# Patient Record
Sex: Female | Born: 1935 | Race: Black or African American | Hispanic: No | Marital: Married | Smoking: Never smoker
Health system: Southern US, Community
[De-identification: ages and names within clinical notes are randomized; demographics above are authoritative.]

## PROBLEM LIST (undated history)

## (undated) DIAGNOSIS — I214 Non-ST elevation (NSTEMI) myocardial infarction: Secondary | ICD-10-CM

## (undated) DIAGNOSIS — J982 Interstitial emphysema: Secondary | ICD-10-CM

## (undated) DIAGNOSIS — J849 Interstitial pulmonary disease, unspecified: Secondary | ICD-10-CM

## (undated) HISTORY — DX: Non-ST elevation (NSTEMI) myocardial infarction: I21.4

## (undated) HISTORY — DX: Interstitial emphysema: J98.2

---

## 2004-08-10 ENCOUNTER — Ambulatory Visit: Payer: Self-pay | Admitting: Unknown Physician Specialty

## 2005-07-26 ENCOUNTER — Ambulatory Visit: Payer: Self-pay | Admitting: *Deleted

## 2005-08-07 ENCOUNTER — Ambulatory Visit: Payer: Self-pay | Admitting: *Deleted

## 2006-02-19 ENCOUNTER — Ambulatory Visit: Payer: Self-pay | Admitting: *Deleted

## 2006-08-20 ENCOUNTER — Ambulatory Visit: Payer: Self-pay | Admitting: *Deleted

## 2007-08-12 ENCOUNTER — Ambulatory Visit: Payer: Self-pay | Admitting: *Deleted

## 2007-09-04 ENCOUNTER — Ambulatory Visit: Payer: Self-pay | Admitting: *Deleted

## 2008-09-14 ENCOUNTER — Ambulatory Visit: Payer: Self-pay | Admitting: Family Medicine

## 2009-10-11 ENCOUNTER — Ambulatory Visit: Payer: Self-pay | Admitting: Family Medicine

## 2010-10-13 ENCOUNTER — Ambulatory Visit: Payer: Self-pay | Admitting: Family Medicine

## 2010-11-14 ENCOUNTER — Encounter: Payer: Self-pay | Admitting: Family Medicine

## 2010-11-25 ENCOUNTER — Encounter: Payer: Self-pay | Admitting: Family Medicine

## 2011-10-31 ENCOUNTER — Ambulatory Visit: Payer: Self-pay | Admitting: Family Medicine

## 2012-10-31 ENCOUNTER — Ambulatory Visit: Payer: Self-pay | Admitting: Nurse Practitioner

## 2013-11-04 ENCOUNTER — Ambulatory Visit: Payer: Self-pay | Admitting: Family Medicine

## 2016-03-17 ENCOUNTER — Ambulatory Visit
Admission: RE | Admit: 2016-03-17 | Discharge: 2016-03-17 | Disposition: A | Discharge status: Home / Self Care | Payer: Medicare Other | Source: Ambulatory Visit | Attending: Nurse Practitioner | Admitting: Nurse Practitioner

## 2016-03-17 ENCOUNTER — Other Ambulatory Visit: Payer: Self-pay | Admitting: Nurse Practitioner

## 2016-03-17 ENCOUNTER — Inpatient Hospital Stay: Admit: 2016-03-17 | Payer: Self-pay

## 2016-03-17 DIAGNOSIS — R05 Cough: Secondary | ICD-10-CM | POA: Insufficient documentation

## 2016-03-17 DIAGNOSIS — J449 Chronic obstructive pulmonary disease, unspecified: Secondary | ICD-10-CM | POA: Diagnosis not present

## 2016-03-17 DIAGNOSIS — R059 Cough, unspecified: Secondary | ICD-10-CM

## 2016-05-10 DIAGNOSIS — J8410 Pulmonary fibrosis, unspecified: Secondary | ICD-10-CM | POA: Insufficient documentation

## 2016-11-07 DIAGNOSIS — R918 Other nonspecific abnormal finding of lung field: Secondary | ICD-10-CM | POA: Insufficient documentation

## 2017-02-27 ENCOUNTER — Ambulatory Visit: Payer: Medicare Other

## 2017-08-17 DIAGNOSIS — Z5181 Encounter for therapeutic drug level monitoring: Secondary | ICD-10-CM | POA: Insufficient documentation

## 2017-08-17 DIAGNOSIS — Z79899 Other long term (current) drug therapy: Secondary | ICD-10-CM

## 2017-08-20 DIAGNOSIS — R9389 Abnormal findings on diagnostic imaging of other specified body structures: Secondary | ICD-10-CM | POA: Insufficient documentation

## 2017-10-20 ENCOUNTER — Emergency Department: Payer: Medicare Other

## 2017-10-20 ENCOUNTER — Inpatient Hospital Stay
Admission: EM | Admit: 2017-10-20 | Discharge: 2017-10-21 | DRG: 313 | Disposition: A | Discharge status: Home / Self Care | Payer: Medicare Other | Attending: Internal Medicine | Admitting: Internal Medicine

## 2017-10-20 ENCOUNTER — Other Ambulatory Visit: Payer: Self-pay

## 2017-10-20 DIAGNOSIS — I248 Other forms of acute ischemic heart disease: Secondary | ICD-10-CM | POA: Diagnosis not present

## 2017-10-20 DIAGNOSIS — R079 Chest pain, unspecified: Secondary | ICD-10-CM

## 2017-10-20 DIAGNOSIS — Z9981 Dependence on supplemental oxygen: Secondary | ICD-10-CM

## 2017-10-20 DIAGNOSIS — Z8249 Family history of ischemic heart disease and other diseases of the circulatory system: Secondary | ICD-10-CM | POA: Diagnosis not present

## 2017-10-20 DIAGNOSIS — R778 Other specified abnormalities of plasma proteins: Secondary | ICD-10-CM

## 2017-10-20 DIAGNOSIS — I214 Non-ST elevation (NSTEMI) myocardial infarction: Secondary | ICD-10-CM

## 2017-10-20 DIAGNOSIS — R7989 Other specified abnormal findings of blood chemistry: Secondary | ICD-10-CM

## 2017-10-20 DIAGNOSIS — J810 Acute pulmonary edema: Secondary | ICD-10-CM

## 2017-10-20 DIAGNOSIS — D509 Iron deficiency anemia, unspecified: Secondary | ICD-10-CM | POA: Diagnosis present

## 2017-10-20 DIAGNOSIS — J9610 Chronic respiratory failure, unspecified whether with hypoxia or hypercapnia: Secondary | ICD-10-CM | POA: Diagnosis not present

## 2017-10-20 DIAGNOSIS — J811 Chronic pulmonary edema: Secondary | ICD-10-CM | POA: Diagnosis present

## 2017-10-20 DIAGNOSIS — J849 Interstitial pulmonary disease, unspecified: Secondary | ICD-10-CM | POA: Diagnosis not present

## 2017-10-20 HISTORY — DX: Non-ST elevation (NSTEMI) myocardial infarction: I21.4

## 2017-10-20 LAB — COMPREHENSIVE METABOLIC PANEL
ALBUMIN: 3.8 g/dL (ref 3.5–5.0)
ALK PHOS: 53 U/L (ref 38–126)
ALT: 11 U/L — ABNORMAL LOW (ref 14–54)
AST: 25 U/L (ref 15–41)
Anion gap: 6 (ref 5–15)
BILIRUBIN TOTAL: 0.4 mg/dL (ref 0.3–1.2)
BUN: 16 mg/dL (ref 6–20)
CALCIUM: 8.9 mg/dL (ref 8.9–10.3)
CO2: 35 mmol/L — ABNORMAL HIGH (ref 22–32)
Chloride: 95 mmol/L — ABNORMAL LOW (ref 101–111)
Creatinine, Ser: 0.92 mg/dL (ref 0.44–1.00)
GFR calc non Af Amer: 54 mL/min — ABNORMAL LOW (ref >60)
GLUCOSE: 110 mg/dL — AB (ref 65–99)
POTASSIUM: 4.2 mmol/L (ref 3.5–5.1)
Sodium: 136 mmol/L (ref 135–145)
TOTAL PROTEIN: 7.8 g/dL (ref 6.5–8.1)

## 2017-10-20 LAB — CBC WITH DIFFERENTIAL/PLATELET
BASOS PCT: 1 %
Basophils Absolute: 0.1 10*3/uL (ref 0–0.1)
EOS ABS: 0.2 10*3/uL (ref 0–0.7)
Eosinophils Relative: 6 %
HEMATOCRIT: 30.7 % — AB (ref 35.0–47.0)
Hemoglobin: 10.6 g/dL — ABNORMAL LOW (ref 12.0–16.0)
LYMPHS ABS: 1 10*3/uL (ref 1.0–3.6)
Lymphocytes Relative: 22 %
MCH: 33.6 pg (ref 26.0–34.0)
MCHC: 34.6 g/dL (ref 32.0–36.0)
MCV: 97.2 fL (ref 80.0–100.0)
MONO ABS: 0.3 10*3/uL (ref 0.2–0.9)
MONOS PCT: 8 %
Neutro Abs: 2.7 10*3/uL (ref 1.4–6.5)
Neutrophils Relative %: 63 %
Platelets: 250 10*3/uL (ref 150–440)
RBC: 3.16 MIL/uL — ABNORMAL LOW (ref 3.80–5.20)
RDW: 13 % (ref 11.5–14.5)
WBC: 4.3 10*3/uL (ref 3.6–11.0)

## 2017-10-20 LAB — BRAIN NATRIURETIC PEPTIDE: B Natriuretic Peptide: 90 pg/mL (ref 0.0–100.0)

## 2017-10-20 LAB — TROPONIN I: TROPONIN I: 0.03 ng/mL — AB (ref <0.03)

## 2017-10-20 MED ORDER — ASPIRIN 81 MG PO CHEW
324.0000 mg | CHEWABLE_TABLET | Freq: Once | ORAL | Status: AC
  Administered 2017-10-20: 324 mg via ORAL
  Filled 2017-10-20: qty 4

## 2017-10-20 NOTE — ED Triage Notes (Signed)
Pt states approx one hour ago she experienced "a whoosing" in her left chest. Pt with history of interstital lung disease. Pt is on 3lpm [facility] oxygen chronically. Pt denies pain, dizziness, nausea.

## 2017-10-20 NOTE — ED Provider Notes (Signed)
[facility] Emergency Department Provider Note       Time seen: ----------------------------------------- 9:47 PM on 10/20/2017 -----------------------------------------   I have reviewed the triage vital signs and the nursing notes.  HISTORY   Chief Complaint Chest Pain    HPI Molly Carlson is a 84 y.o. female with a history of interstitial lung disease who presents to the ED for a funny feeling in her chest.  Patient states she experienced a whooshing feeling in her left chest.  She has a history of oxygen dependency, wears 3 L of nasal cannula oxygen all the time.  She denies pain, dizziness or nausea.  She denies any other complaints at this time.  No past medical history on file.  There are no active problems to display for this patient.  Allergies Patient has no known allergies.  Social History Social History   Tobacco Use  . Smoking status: Not on file  Substance Use Topics  . Alcohol use: Not on file  . Drug use: Not on file   Review of Systems Constitutional: Negative for fever. Cardiovascular: Positive for chest pain Respiratory: Positive for chronic difficulty breathing Gastrointestinal: Negative for abdominal pain, vomiting and diarrhea. Musculoskeletal: Negative for back pain. Skin: Negative for rash. Neurological: Negative for headaches, focal weakness or numbness.  All systems negative/normal/unremarkable except as stated in the HPI  ____________________________________________   PHYSICAL EXAM:  VITAL SIGNS: ED Triage Vitals [10/20/17 2023]  Enc Vitals Group     BP (!) 147/108     Pulse Rate 78     Resp      Temp 98.8 F (37.1 C)     Temp Source Oral     SpO2 99 %     Weight 90 lb (40.8 kg)     Height  (1.549 m)     Head Circumference      Peak Flow      Pain Score 0     Pain Loc      Pain Edu?      Excl. in GC?    Constitutional: Alert and oriented.  Chronically ill-appearing, no  distress Eyes: Conjunctivae are normal. Normal extraocular movements. ENT   Head: Normocephalic and atraumatic.   Nose: No congestion/rhinnorhea.   Mouth/Throat: Mucous membranes are moist.   Neck: No stridor.  Cardiovascular: Normal rate, regular rhythm. No murmurs, rubs, or gallops. Respiratory: Normal respiratory effort without tachypnea nor retractions.  Rales are noted bilaterally Gastrointestinal: Soft and nontender. Normal bowel sounds Musculoskeletal: Nontender with normal range of motion in extremities. No lower extremity tenderness nor edema.  Severe kyphosis. Neurologic:  Normal speech and language. No gross focal neurologic deficits are appreciated.  Skin:  Skin is warm, dry and intact. No rash noted. Psychiatric: Mood and affect are normal. Speech and behavior are normal.  ____________________________________________  EKG: Interpreted by me.  Sinus rhythm rate 68 bpm, normal PR interval, normal QRS, normal QT.  ____________________________________________  ED COURSE:  As part of my medical decision making, I reviewed the following data within the electronic MEDICAL RECORD NUMBER History obtained from family if available, nursing notes, old chart and ekg, as well as notes from prior ED visits. Patient presented for abnormal feeling in her chest, we will assess with labs and imaging as indicated at this time.   Procedures ____________________________________________   LABS (pertinent positives/negatives)  Labs Reviewed  CBC WITH DIFFERENTIAL/PLATELET - Abnormal; Notable for the following components:      Result Value  RBC 3.16 (*)    Hemoglobin 10.6 (*)    HCT 30.7 (*)    All other components within normal limits  COMPREHENSIVE METABOLIC PANEL - Abnormal; Notable for the following components:   Chloride 95 (*)    CO2 35 (*)    Glucose, Bld 110 (*)    ALT 11 (*)    GFR calc non Af Amer 54 (*)    All other components within normal limits  TROPONIN I -  Abnormal; Notable for the following components:   Troponin I 0.03 (*)    All other components within normal limits  BRAIN NATRIURETIC PEPTIDE    RADIOLOGY Images were viewed by me  Chest x-ray reveals pulmonary edema superimposed on interstitial lung disease  ____________________________________________  DIFFERENTIAL DIAGNOSIS   Interstitial lung disease, pneumonia, pneumothorax, MI, pulmonary edema  FINAL ASSESSMENT AND PLAN  Chest pain, elevated troponin   Plan: The patient had presented for abnormal sensation in her chest. Patient's labs did reveal a slightly elevated troponin of 0.03. Patient's imaging revealed pulmonary edema which is a new finding for her.  I will discuss with the hospitalist for admission.   Molly Dash, MD   Note: This note was generated in part or whole with voice recognition software. Voice recognition is usually quite accurate but there are transcription errors that can and very often do occur. I apologize for any typographical errors that were not detected and corrected.     Molly Filbert, MD 10/20/17 2151

## 2017-10-20 NOTE — H&P (Signed)
[facility] [place] at [facility]   PATIENT NAME: Molly Carlson    MR#:  [id number]  DATE OF BIRTH:  [birth year]  DATE OF ADMISSION:  10/20/2017  PRIMARY CARE PHYSICIAN: Center, [facility]   REQUESTING/REFERRING PHYSICIAN:   CHIEF COMPLAINT:   Chief Complaint  Patient presents with  . Chest Pain    HISTORY OF PRESENT ILLNESS: Molly Carlson  is a 84 y.o. female with a known history of chronic respiratory failure secondary to interstitial lung disease, on continuous 3 L oxygen per nasal cannula at home. Patient presented to emergency room for an episode of whooshing feeling in her left chest, while at rest, at home, just 2 hours before arrival to emergency room.  This symptom was moderate in severity and scared her.  The episode lasted approximately 5 minutes and resolved on its own.  Patient denies having similar experience in the past.  There was no dizziness, nausea, diaphoreses; no cough, fever or chills.  No worsening shortness of breath.  No lower extremities edema. Patient is currently chest pain-free. Blood test done emergency room were remarkable for hemoglobin level of 10.6, which is baseline for this patient.  First troponin level was 0.03.  EKG shows sinus rate with a heart rate of 68, normal intervals, no acute ischemic changes. Chest x-ray, reviewed by myself, reveals mild diffuse interstitial edema superimposed on chronic interstitial lung disease.  Patient will be placed on observation to rule out acute coronary syndrome.  PAST MEDICAL HISTORY: Chronic respiratory failure; interstitial lung disease; anemia of iron deficiency.  PAST SURGICAL HISTORY: Patient does not recall any surgeries.  SOCIAL HISTORY:  Patient denies ever smoking or drinking alcohol.  No recreational drug use.  FAMILY HISTORY: Hypertension and hyperlipidemia in both parents  DRUG ALLERGIES: No Known Allergies  REVIEW OF SYSTEMS:   CONSTITUTIONAL: No  fever, no new fatigue or weakness.  EYES: No blurred or double vision.  EARS, NOSE, AND THROAT: No tinnitus or ear pain.  RESPIRATORY: Positive for chronic shortness of breath with exertion, going on for several years, due to interstitial lung disease.  No cough, wheezing or hemoptysis.   CARDIOVASCULAR: Positive for chest pain; no orthopnea, edema.  GASTROINTESTINAL: No nausea, vomiting, diarrhea or abdominal pain.  GENITOURINARY: No dysuria, hematuria.  ENDOCRINE: No polyuria, nocturia,  HEMATOLOGY: No bleeding. SKIN: No rash or lesion. MUSCULOSKELETAL: No joint pain.   NEUROLOGIC: No focal weakness.  PSYCHIATRY: No anxiety or depression.   MEDICATIONS AT HOME:  Prior to Admission medications   Medication Sig Start Date End Date Taking? Authorizing Provider  azaTHIOprine (IMURAN) 50 MG tablet Take 50 mg by mouth daily.   Yes [provider]  FEROSUL 325 (65 Fe) MG tablet Take 325 mg by mouth 2 (two) times daily.   Yes [provider]  Prenatal Vit-Fe Fumarate-FA (PRENATAL MULTIVITAMIN) TABS tablet Take 1 tablet by mouth daily at 12 noon.   Yes [provider]      PHYSICAL EXAMINATION:   VITAL SIGNS: Blood pressure (!) 147/108, pulse 78, temperature 98.8 F (37.1 C), temperature source Oral, height  (1.549 m), weight 37.6 kg (83 lb), SpO2 99 %.  GENERAL:  84 y.o.-year-old patient lying in the bed with no acute distress.  On 3 L oxygen per nasal cannula. EYES: Pupils equal, round, reactive to light and accommodation. No scleral icterus.  HEENT: Head atraumatic, normocephalic. Oropharynx and nasopharynx clear.  NECK:  Supple, no jugular venous distention. No thyroid enlargement, no  tenderness.  LUNGS: Reduced breath sounds bilaterally, no wheezing, rales,rhonchi or crepitation. No use of accessory muscles of respiration.  CARDIOVASCULAR: S1, S2 normal. No S3/S4.  ABDOMEN: Soft, nontender, nondistended. Bowel sounds present. No organomegaly or mass.   EXTREMITIES: No pedal edema, cyanosis, or clubbing.  NEUROLOGIC: No focal weakness.  PSYCHIATRIC: The patient is alert and oriented x 3.  SKIN: No obvious rash, lesion, or ulcer.   LABORATORY PANEL:   CBC Recent Labs  Lab 10/20/17 2038  WBC 4.3  HGB 10.6*  HCT 30.7*  PLT 250  MCV 97.2  MCH 33.6  MCHC 34.6  RDW 13.0  LYMPHSABS 1.0  MONOABS 0.3  EOSABS 0.2  BASOSABS 0.1   ------------------------------------------------------------------------------------------------------------------  Chemistries  Recent Labs  Lab 10/20/17 2038  NA 136  K 4.2  CL 95*  CO2 35*  GLUCOSE 110*  BUN 16  CREATININE 0.92  CALCIUM 8.9  AST 25  ALT 11*  ALKPHOS 53  BILITOT 0.4   ------------------------------------------------------------------------------------------------------------------ estimated creatinine clearance is 25.1 mL/min (by C-G formula based on SCr of 0.92 mg/dL). ------------------------------------------------------------------------------------------------------------------ No results for input(s): TSH, T4TOTAL, T3FREE, THYROIDAB in the last 72 hours.  Invalid input(s): FREET3   Coagulation profile No results for input(s): INR, PROTIME in the last 168 hours. ------------------------------------------------------------------------------------------------------------------- No results for input(s): DDIMER in the last 72 hours. -------------------------------------------------------------------------------------------------------------------  Cardiac Enzymes Recent Labs  Lab 10/20/17 2038  TROPONINI 0.03*   ------------------------------------------------------------------------------------------------------------------ Invalid input(s): POCBNP  ---------------------------------------------------------------------------------------------------------------  Urinalysis No results found for: COLORURINE, APPEARANCEUR, LABSPEC, PHURINE, GLUCOSEU, HGBUR,  BILIRUBINUR, KETONESUR, PROTEINUR, UROBILINOGEN, NITRITE, LEUKOCYTESUR   RADIOLOGY: Dg Chest 2 View  Result Date: 10/20/2017 CLINICAL DATA:  Interstitial lung disease. Patient is on 3 L of oxygen chronically. Presents with a "wooshing " in the left chest. EXAM: CHEST - 2 VIEW COMPARISON:  03/17/2016 FINDINGS: Stable cardiomegaly and aortic atherosclerosis. Diffuse interstitial lung disease with fine interstitial lung markings noted bilaterally with superimposed areas of interstitial edema on current exam. Stable thoracic kyphosis attributable to multilevel degenerative disc disease and mild anterior mid to upper thoracic wedging. IMPRESSION: Mild diffuse interstitial edema superimposed on chronic interstitial lung disease. Stable aortic atherosclerosis and cardiomegaly. Electronically Signed   By: Tollie Eth M.D.   On: 10/20/2017 21:07    EKG: Orders placed or performed during the hospital encounter of 10/20/17  . ED EKG  . ED EKG  . EKG 12-Lead  . EKG 12-Lead    IMPRESSION AND PLAN:  1. NSTEMI.  First troponin level is 0.03.  This is likely secondary to demand ischemia from chronic respiratory failure.  However, we will rule out ACS.  Will start patient on aspirin will monitor patient on telemetry and follow troponin levels.  Will check 2D echo and consult cardiology for further evaluation and treatment. 2.  Chronic respiratory failure, secondary to pulmonary interstitial fibrosis, on 3 L continuous oxygen, at home.  Continue management per pulmonary services, as outpatient.  Continue oxygen treatment and Imuran. 3.  Pulmonary interstitial fibrosis, see management as above under #2.  All the records are reviewed and case discussed with ED provider. Management plans discussed with the patient, family and they are in agreement.  CODE STATUS: FULL    TOTAL TIME TAKING CARE OF THIS PATIENT: 40 minutes.    Cammy Copa M.D on 10/20/2017 at 11:33 PM  Between 7am to 6pm - Pager -  [phone]  After 6pm go to [email or web address] - password [email or web address] [facility]  [place] [place] [facility]  Office  [phone]  CC: Primary  care physician; Center, [facility]

## 2017-10-21 ENCOUNTER — Encounter: Payer: Self-pay | Admitting: Emergency Medicine

## 2017-10-21 ENCOUNTER — Other Ambulatory Visit: Payer: Self-pay

## 2017-10-21 DIAGNOSIS — J810 Acute pulmonary edema: Secondary | ICD-10-CM | POA: Diagnosis not present

## 2017-10-21 DIAGNOSIS — R079 Chest pain, unspecified: Secondary | ICD-10-CM | POA: Diagnosis not present

## 2017-10-21 LAB — BASIC METABOLIC PANEL
Anion gap: 5 (ref 5–15)
BUN: 14 mg/dL (ref 6–20)
CHLORIDE: 97 mmol/L — AB (ref 101–111)
CO2: 37 mmol/L — ABNORMAL HIGH (ref 22–32)
CREATININE: 0.87 mg/dL (ref 0.44–1.00)
Calcium: 8.9 mg/dL (ref 8.9–10.3)
GFR, EST NON AFRICAN AMERICAN: 58 mL/min — AB (ref >60)
Glucose, Bld: 98 mg/dL (ref 65–99)
Potassium: 4.1 mmol/L (ref 3.5–5.1)
SODIUM: 139 mmol/L (ref 135–145)

## 2017-10-21 LAB — GLUCOSE, CAPILLARY: GLUCOSE-CAPILLARY: 80 mg/dL (ref 65–99)

## 2017-10-21 LAB — CBC
HCT: 30.5 % — ABNORMAL LOW (ref 35.0–47.0)
Hemoglobin: 10.3 g/dL — ABNORMAL LOW (ref 12.0–16.0)
MCH: 33.1 pg (ref 26.0–34.0)
MCHC: 33.9 g/dL (ref 32.0–36.0)
MCV: 97.5 fL (ref 80.0–100.0)
PLATELETS: 250 10*3/uL (ref 150–440)
RBC: 3.12 MIL/uL — AB (ref 3.80–5.20)
RDW: 12.8 % (ref 11.5–14.5)
WBC: 4.7 10*3/uL (ref 3.6–11.0)

## 2017-10-21 LAB — TROPONIN I: Troponin I: <0.03 ng/mL (ref <0.03)

## 2017-10-21 MED ORDER — AZATHIOPRINE 50 MG PO TABS
50.0000 mg | ORAL_TABLET | Freq: Every day | ORAL | Status: DC
  Administered 2017-10-21: 50 mg via ORAL
  Filled 2017-10-21: qty 1

## 2017-10-21 MED ORDER — HYDROCODONE-ACETAMINOPHEN 5-325 MG PO TABS: 1.0000 | ORAL_TABLET | ORAL | Status: DC | PRN

## 2017-10-21 MED ORDER — TRAZODONE HCL 50 MG PO TABS: 25.0000 mg | ORAL_TABLET | Freq: Every evening | ORAL | Status: DC | PRN

## 2017-10-21 MED ORDER — PRENATAL MULTIVITAMIN CH
1.0000 | ORAL_TABLET | Freq: Every day | ORAL | Status: DC
  Administered 2017-10-21: 1 via ORAL
  Filled 2017-10-21: qty 1

## 2017-10-21 MED ORDER — ACETAMINOPHEN 325 MG PO TABS: 650.0000 mg | ORAL_TABLET | Freq: Four times a day (QID) | ORAL | Status: DC | PRN

## 2017-10-21 MED ORDER — ONDANSETRON HCL 4 MG/2ML IJ SOLN: 4.0000 mg | Freq: Four times a day (QID) | INTRAMUSCULAR | Status: DC | PRN

## 2017-10-21 MED ORDER — HEPARIN SODIUM (PORCINE) 5000 UNIT/ML IJ SOLN
5000.0000 [IU] | Freq: Three times a day (TID) | INTRAMUSCULAR | Status: DC
  Filled 2017-10-21: qty 1

## 2017-10-21 MED ORDER — DOCUSATE SODIUM 100 MG PO CAPS
100.0000 mg | ORAL_CAPSULE | Freq: Two times a day (BID) | ORAL | Status: DC
  Administered 2017-10-21: 100 mg via ORAL
  Filled 2017-10-21: qty 1

## 2017-10-21 MED ORDER — ONDANSETRON HCL 4 MG PO TABS: 4.0000 mg | ORAL_TABLET | Freq: Four times a day (QID) | ORAL | Status: DC | PRN

## 2017-10-21 MED ORDER — FERROUS SULFATE 325 (65 FE) MG PO TABS
325.0000 mg | ORAL_TABLET | Freq: Two times a day (BID) | ORAL | Status: DC
  Administered 2017-10-21: 325 mg via ORAL
  Filled 2017-10-21: qty 1

## 2017-10-21 MED ORDER — BISACODYL 5 MG PO TBEC: 5.0000 mg | DELAYED_RELEASE_TABLET | Freq: Every day | ORAL | Status: DC | PRN

## 2017-10-21 MED ORDER — ACETAMINOPHEN 650 MG RE SUPP: 650.0000 mg | Freq: Four times a day (QID) | RECTAL | Status: DC | PRN

## 2017-10-21 NOTE — Care Management Obs Status (Signed)
MEDICARE OBSERVATION STATUS NOTIFICATION   Patient Details  Name: MICHELENE KENISTON MRN: [id number] Date of Birth: [birth year]   Medicare Observation Status Notification Given:  Yes    Marily Memos, RN 10/21/2017, 9:22 AM

## 2017-10-21 NOTE — Plan of Care (Signed)
  Problem: Clinical Measurements: Goal: Cardiovascular complication will be avoided Outcome: Progressing   Problem: Education: Goal: Knowledge of General Education information will improve Outcome: Progressing

## 2017-10-21 NOTE — ED Notes (Signed)
Transport patient to floor room 2A-240.AS

## 2017-10-21 NOTE — ED Notes (Signed)
Delay calling report d/t CODE STROKE in room 16 and establish IV

## 2017-10-21 NOTE — Discharge Summary (Signed)
[facility] - [place] at [facility]   PATIENT NAME: Molly Carlson    MR#:  [id number]  DATE OF BIRTH:  [birth year]  DATE OF ADMISSION:  10/20/2017 ADMITTING PHYSICIAN: Cammy Copa, MD  DATE OF DISCHARGE: 10/21/2017  PRIMARY CARE PHYSICIAN: Center, [facility]    ADMISSION DIAGNOSIS:  Acute pulmonary edema (HCC) [J81.0] Elevated troponin I level [R74.8] Nonspecific chest pain [R07.9]  DISCHARGE DIAGNOSIS:  Chest pain--appears non cardiac Known h/o Interstitial lung disease on Chronic home oxygen  SECONDARY DIAGNOSIS:  History reviewed. No pertinent past medical history.  HOSPITAL COURSE:    Molly Carlson  is a 84 y.o. female with a known history of chronic respiratory failure secondary to interstitial lung disease, on continuous 3 L oxygen per nasal cannula at home. Patient presented to emergency room for an episode of whooshing feeling in her left chest, while at rest, at home, just 2 hours before arrival to emergency room.  1. Chest discomfort -ruled out MI so far with neg troponin x 2 -This is likely secondary to demand ischemia from chronic respiratory failure. -pt is not having any complaints and wants to go home. -EKG no acute changes. -Patient has no underlying cardiac history.  2.  Chronic respiratory failure, secondary to pulmonary interstitial fibrosis, on 3 L continuous oxygen, at home.  - Continue management per pulmonary services, as outpatient.   Continue oxygen treatment and Imuran.  3.  Pulmonary interstitial fibrosis, see management as above under #2.   Overall hemodynamically stable at baseline. Patient is requesting to go home.   CONSULTS OBTAINED:    DRUG ALLERGIES:  No Known Allergies  DISCHARGE MEDICATIONS:   Allergies as of 10/21/2017   No Known Allergies     Medication List    TAKE these medications   azaTHIOprine 50 MG tablet Commonly known as:  IMURAN Take 50 mg by mouth daily.    FEROSUL 325 (65 FE) MG tablet Generic drug:  ferrous sulfate Take 325 mg by mouth 2 (two) times daily.   prenatal multivitamin Tabs tablet Take 1 tablet by mouth daily at 12 noon.       If you experience worsening of your admission symptoms, develop shortness of breath, life threatening emergency, suicidal or homicidal thoughts you must seek medical attention immediately by calling 911 or calling your MD immediately  if symptoms less severe.  You Must read complete instructions/literature along with all the possible adverse reactions/side effects for all the Medicines you take and that have been prescribed to you. Take any new Medicines after you have completely understood and accept all the possible adverse reactions/side effects.   Please note  You were cared for by a hospitalist during your hospital stay. If you have any questions about your discharge medications or the care you received while you were in the hospital after you are discharged, you can call the unit and asked to speak with the hospitalist on call if the hospitalist that took care of you is not available. Once you are discharged, your primary care physician will handle any further medical issues. Please note that NO REFILLS for any discharge medications will be authorized once you are discharged, as it is imperative that you return to your primary care physician (or establish a relationship with a primary care physician if you do not have one) for your aftercare needs so that they can reassess your need for medications and monitor your lab values. Today   SUBJECTIVE   Doing well. requesting  to go home  VITAL SIGNS:  Blood pressure 101/77, pulse (!) 52, temperature (!) 97.3 F (36.3 C), temperature source Oral, resp. rate 18, height  (1.549 m), weight 35.8 kg (79 lb), SpO2 100 %.  I/O:    Intake/Output Summary (Last 24 hours) at 10/21/2017 0942 Last data filed at 10/21/2017 0356 Gross per 24 hour  Intake -   Output 0 ml  Net 0 ml    PHYSICAL EXAMINATION:  GENERAL:  84 y.o.-year-old patient lying in the bed with no acute distress. Thin, fraile EYES: Pupils equal, round, reactive to light and accommodation. No scleral icterus. Extraocular muscles intact.  HEENT: Head atraumatic, normocephalic. Oropharynx and nasopharynx clear.  NECK:  Supple, no jugular venous distention. No thyroid enlargement, no tenderness.  LUNGS: Normal breath sounds bilaterally, no wheezing, no rales, no rhonchi  -few scattered crepitation. No use of accessory muscles of respiration.  CARDIOVASCULAR: S1, S2 normal. No murmurs, rubs, or gallops.  ABDOMEN: Soft, non-tender, non-distended. Bowel sounds present. No organomegaly or mass.  EXTREMITIES: No pedal edema, cyanosis, or clubbing.  NEUROLOGIC: Cranial nerves II through XII are intact. Muscle strength 5/5 in all extremities. Sensation intact. Gait not checked.  PSYCHIATRIC:  patient is alert and oriented x 3.  SKIN: No obvious rash, lesion, or ulcer.   DATA REVIEW:   CBC  Recent Labs  Lab 10/21/17 0206  WBC 4.7  HGB 10.3*  HCT 30.5*  PLT 250    Chemistries  Recent Labs  Lab 10/20/17 2038 10/21/17 0206  NA 136 139  K 4.2 4.1  CL 95* 97*  CO2 35* 37*  GLUCOSE 110* 98  BUN 16 14  CREATININE 0.92 0.87  CALCIUM 8.9 8.9  AST 25  --   ALT 11*  --   ALKPHOS 53  --   BILITOT 0.4  --     Microbiology Results   No results found for this or any previous visit (from the past 240 hour(s)).  RADIOLOGY:  Dg Chest 2 View  Result Date: 10/20/2017 CLINICAL DATA:  Interstitial lung disease. Patient is on 3 L of oxygen chronically. Presents with a "wooshing " in the left chest. EXAM: CHEST - 2 VIEW COMPARISON:  03/17/2016 FINDINGS: Stable cardiomegaly and aortic atherosclerosis. Diffuse interstitial lung disease with fine interstitial lung markings noted bilaterally with superimposed areas of interstitial edema on current exam. Stable thoracic kyphosis  attributable to multilevel degenerative disc disease and mild anterior mid to upper thoracic wedging. IMPRESSION: Mild diffuse interstitial edema superimposed on chronic interstitial lung disease. Stable aortic atherosclerosis and cardiomegaly. Electronically Signed   By: Tollie Eth M.D.   On: 10/20/2017 21:07     Management plans discussed with the patient, family and they are in agreement.  CODE STATUS:     Code Status Orders  (From admission, onward)        Start     Ordered   10/21/17 0116  Full code  Continuous     10/21/17 0115    Code Status History    This patient has a current code status but no historical code status.    Advance Directive Documentation     Most Recent Value  Type of Advance Directive  Healthcare Power of Attorney, Living will  Pre-existing out of facility DNR order (yellow form or pink MOST form)  -  "MOST" Form in Place?  -      TOTAL TIME TAKING CARE OF THIS PATIENT: 40 minutes.    Nani [email or web address].D  on 10/21/2017 at 9:42 AM  Between 7am to 6pm - Pager - [phone] After 6pm go to [email or web address] - Social research officer, government  [facility]  Office  [phone]  CC: Primary care physician; Center, [facility]

## 2017-10-21 NOTE — Progress Notes (Signed)
Pt to be discharged home this morning, she voices no complaints with MD at bedside, states she "feels fine" "ready to go home." VSS, discharged home with husband, follow up plan/information provided, no medication changes.

## 2017-10-23 ENCOUNTER — Telehealth: Payer: Self-pay

## 2017-10-23 NOTE — Telephone Encounter (Signed)
EMMI Follow-up: Molly Carlson called and left me a VM saying she had received a call from my number.  I called and left VM and explained our follow-up process post discharge and to expect another automated call.  If she has any concerns please call my number.

## 2018-01-08 ENCOUNTER — Emergency Department: Payer: Medicare Other

## 2018-01-08 ENCOUNTER — Other Ambulatory Visit: Payer: Self-pay

## 2018-01-08 ENCOUNTER — Emergency Department
Admission: EM | Admit: 2018-01-08 | Discharge: 2018-01-09 | Disposition: A | Discharge status: Home / Self Care | Payer: Medicare Other | Attending: Emergency Medicine | Admitting: Emergency Medicine

## 2018-01-08 DIAGNOSIS — J982 Interstitial emphysema: Secondary | ICD-10-CM | POA: Diagnosis not present

## 2018-01-08 DIAGNOSIS — R079 Chest pain, unspecified: Secondary | ICD-10-CM | POA: Diagnosis present

## 2018-01-08 DIAGNOSIS — I252 Old myocardial infarction: Secondary | ICD-10-CM | POA: Diagnosis not present

## 2018-01-08 DIAGNOSIS — Z79899 Other long term (current) drug therapy: Secondary | ICD-10-CM | POA: Diagnosis not present

## 2018-01-08 HISTORY — DX: Interstitial pulmonary disease, unspecified: J84.9

## 2018-01-08 LAB — TROPONIN I
TROPONIN I: 0.04 ng/mL — AB (ref <0.03)
Troponin I: 0.03 ng/mL (ref <0.03)

## 2018-01-08 LAB — CBC
HCT: 34.8 % — ABNORMAL LOW (ref 35.0–47.0)
Hemoglobin: 12.4 g/dL (ref 12.0–16.0)
MCH: 34.8 pg — ABNORMAL HIGH (ref 26.0–34.0)
MCHC: 35.5 g/dL (ref 32.0–36.0)
MCV: 98 fL (ref 80.0–100.0)
PLATELETS: 277 10*3/uL (ref 150–440)
RBC: 3.55 MIL/uL — ABNORMAL LOW (ref 3.80–5.20)
RDW: 13.7 % (ref 11.5–14.5)
WBC: 8.6 10*3/uL (ref 3.6–11.0)

## 2018-01-08 LAB — COMPREHENSIVE METABOLIC PANEL
ALT: 18 U/L (ref 0–44)
AST: 26 U/L (ref 15–41)
Albumin: 3.8 g/dL (ref 3.5–5.0)
Alkaline Phosphatase: 45 U/L (ref 38–126)
Anion gap: 9 (ref 5–15)
BILIRUBIN TOTAL: 0.7 mg/dL (ref 0.3–1.2)
BUN: 17 mg/dL (ref 8–23)
CALCIUM: 9.2 mg/dL (ref 8.9–10.3)
CO2: 34 mmol/L — ABNORMAL HIGH (ref 22–32)
Chloride: 93 mmol/L — ABNORMAL LOW (ref 98–111)
Creatinine, Ser: 0.86 mg/dL (ref 0.44–1.00)
GFR, EST NON AFRICAN AMERICAN: 59 mL/min — AB (ref >60)
Glucose, Bld: 103 mg/dL — ABNORMAL HIGH (ref 70–99)
POTASSIUM: 4 mmol/L (ref 3.5–5.1)
Sodium: 136 mmol/L (ref 135–145)
Total Protein: 7.8 g/dL (ref 6.5–8.1)

## 2018-01-08 NOTE — ED Notes (Signed)
This tech answered pts call bell. Pt requested TV remote, warm blanket, tissues and paper towels. This tech got the pt the four items and asked the pt if she needed anything else. The pt denied needing anything else at the time. The call bell was placed within reach of the pt before this tech left the room.

## 2018-01-08 NOTE — ED Notes (Signed)
Report given to Andrea RN

## 2018-01-08 NOTE — ED Notes (Signed)
Called Xray and informed that they don't perform the UGI W/KUB at night and that per the ER MD earlier that it would be done tomorrow. Will inform the Admitting MD Anne HahnWillis

## 2018-01-08 NOTE — ED Notes (Addendum)
Verbal permission via telephone received from pt's husband (POA) to release information to pt's daughter; verified by verbal conformation with [name], [employer] Clerk.  Molly CommanderReginald Carlson (husband) [phone] Molly StabsJackie Carlson (daughter) [phone]

## 2018-01-08 NOTE — ED Provider Notes (Signed)
[facility] Provider Note  ____________________________________________   First MD Initiated Contact with Patient 01/08/18 1730     (approximate)  I have reviewed the triage vital signs and the nursing notes.   HISTORY  Chief Complaint Chest Pain  HPI Molly Carlson is a 84 y.o. female Struve interstitial lung disease on nasal cannula oxygen who was presented to the emergency department today with left-sided chest pain that is now resolved.  Says that on Sunday she felt like she had crackers stuck in her throat.  She then coughed them out forcefully.  However, she says that she has been feeling vaguely ill since then and saw her doctor this morning who discharged her home without any further treatment.  She said that after being discharged she then had diffuse chest pain which resolved quickly.  However, she was concerned enough to come to the emergency department for further evaluation.  Denies any shortness of breath.  Denies any pain at this time.   Past Medical History:  Diagnosis Date  . Interstitial lung disease [facility](HCC)     Patient Active Problem List   Diagnosis Date Noted  . NSTEMI (non-ST elevated myocardial infarction) (HCC) 10/20/2017    History reviewed. No pertinent surgical history.  Prior to Admission medications   Medication Sig Start Date End Date Taking? Authorizing Provider  ATROVENT HFA 17 MCG/ACT inhaler Inhale 2 puffs into the lungs 2 (two) times daily. 11/30/17  Yes [provider]  azaTHIOprine (IMURAN) 50 MG tablet Take 100 mg by mouth daily.    Yes [provider]  FEROSUL 325 (65 Fe) MG tablet Take 325 mg by mouth 2 (two) times daily.   Yes [provider]  montelukast (SINGULAIR) 10 MG tablet Take 10 mg by mouth at bedtime. 11/16/17  Yes [provider]  predniSONE (DELTASONE) 10 MG tablet Take 15 mg by mouth daily. 12/15/17  Yes [provider]  Prenatal  Vit-Fe Fumarate-FA (PRENATAL 19) tablet Chew 1 tablet by mouth daily. 11/05/17  Yes [provider]  Prenatal Vit-Fe Fumarate-FA (PRENATAL MULTIVITAMIN) TABS tablet Take 1 tablet by mouth daily at 12 noon.   Yes [provider]  PROAIR HFA 108 (90 Base) MCG/ACT inhaler Inhale 2 puffs into the lungs every 6 (six) hours as needed. 11/16/17  Yes [provider]  sulfamethoxazole-trimethoprim (BACTRIM DS,SEPTRA DS) 800-160 MG tablet Take 1 tablet by mouth 3 (three) times a week. MONDAY,WEDNESDAY,FRIDAY 12/26/17  Yes [provider]    Allergies Patient has no known allergies.  No family history on file.  Social History Social History   Tobacco Use  . Smoking status: Never Smoker  . Smokeless tobacco: Never Used  Substance Use Topics  . Alcohol use: Never    Frequency: Never  . Drug use: Never    Review of Systems  Constitutional: No fever/chills Eyes: No visual changes. ENT: No sore throat. Cardiovascular: As above Respiratory: Denies shortness of breath. Gastrointestinal: No abdominal pain.  No nausea, no vomiting.  No diarrhea.  No constipation. Genitourinary: Negative for dysuria. Musculoskeletal: Negative for back pain. Skin: Negative for rash. Neurological: Negative for headaches, focal weakness or numbness.   ____________________________________________   PHYSICAL EXAM:  VITAL SIGNS: ED Triage Vitals [01/08/18 1553]  Enc Vitals Group     BP 123/68     Pulse Rate (!) 103     Resp 15     Temp 98.2 F (36.8 C)     Temp src  SpO2 97 %     Weight 80 lb (36.3 kg)     Height 5\' 3"  (1.6 m)     Head Circumference      Peak Flow      Pain Score 0     Pain Loc      Pain Edu?      Excl. in GC?     Constitutional: Alert and oriented. Well appearing and in no acute distress. Eyes: Conjunctivae are normal.  Head: Atraumatic. Nose: No congestion/rhinnorhea. Mouth/Throat: Mucous membranes are moist.  Neck: No stridor.     Cardiovascular: Normal rate, regular rhythm. Grossly normal heart sounds. Respiratory: Normal respiratory effort.  No retractions. Lungs CTAB.  No crepitus palpated. Gastrointestinal: Soft and nontender. No distention. No CVA tenderness. Musculoskeletal: No lower extremity tenderness nor edema.  No joint effusions. Neurologic:  Normal speech and language. No gross focal neurologic deficits are appreciated. Skin:  Skin is warm, dry and intact. No rash noted. Psychiatric: Mood and affect are normal. Speech and behavior are normal.  ____________________________________________   LABS (all labs ordered are listed, but only abnormal results are displayed)  Labs Reviewed  CBC - Abnormal; Notable for the following components:      Result Value   RBC 3.55 (*)    HCT 34.8 (*)    MCH 34.8 (*)    All other components within normal limits  TROPONIN I - Abnormal; Notable for the following components:   Troponin I 0.03 (*)    All other components within normal limits  COMPREHENSIVE METABOLIC PANEL - Abnormal; Notable for the following components:   Chloride 93 (*)    CO2 34 (*)    Glucose, Bld 103 (*)    GFR calc non Af Amer 59 (*)    All other components within normal limits  TROPONIN I   ____________________________________________  EKG  ED ECG REPORT I, Arelia Longest, the attending physician, personally viewed and interpreted this ECG.   Date: 01/08/2018  EKG Time: 1603  Rate: 98  Rhythm: Sinus rhythm  Axis: Normal  Intervals:none  ST&T Change: No ST segment elevation or depression.  No abnormal T wave inversion.  ____________________________________________  RADIOLOGY  Extensive bilateral interstitial lung disease with subpleural areas of reticular fine interstitial lung markings compatible with interstitial fibrosis.  Pneumomediastinum tracking up into the base of the neck in the left and supraclavicular soft  tissues. ____________________________________________   PROCEDURES  Procedure(s) performed:   Procedures  Critical Care performed:   ____________________________________________   INITIAL IMPRESSION / ASSESSMENT AND PLAN / ED COURSE  Pertinent labs & imaging results that were available during my care of the patient were reviewed by me and considered in my medical decision making (see chart for details).  Differential diagnosis includes, but is not limited to, ACS, aortic dissection, pulmonary embolism, cardiac tamponade, pneumothorax, pneumonia, pericarditis, myocarditis, GI-related causes including esophagitis/gastritis, and musculoskeletal chest wall pain.   As part of my medical decision making, I reviewed the following data within the electronic MEDICAL RECORD NUMBER Notes from prior ED visits  ----------------------------------------- 8:33 PM on 01/08/2018 -----------------------------------------  Patient with likely increased intrathoracic pressure this past Sunday when she coughed up the food bolus.  However with recurrent chest pain today and findings of pneumomediastinum.  Concern for possible ruptured bleb versus ruptured esophagus.  Discussed the case with Dr. Thelma Barge of CT surgery who recommends an upper GI.  If negative, the patient will be admitted to medicine for observation.  Signed out to  Dr. Elpidio Anis.  Also discussed with Dr. Scotty Court in case of evidence of esophageal rupture. ____________________________________________   FINAL CLINICAL IMPRESSION(S) / ED DIAGNOSES  Final diagnoses:  Pneumomediastinum (HCC)      NEW MEDICATIONS STARTED DURING THIS VISIT:  New Prescriptions   No medications on file     Note:  This document was prepared using Dragon voice recognition software and may include unintentional dictation errors.     Myrna Blazer, MD 01/08/18 2034

## 2018-01-08 NOTE — ED Triage Notes (Signed)
Pt went to PCP this am for c/o chest pain and they told her to come back in 2 weeks - when she got home the chest pain got worse and she had nausea and felt like she needed to "burp" - pt reports increased shortness of breath - denies dizziness

## 2018-01-08 NOTE — ED Notes (Signed)
Per Dr Scotty CourtStafford, okay for pt to eat and drink.

## 2018-01-08 NOTE — ED Notes (Addendum)
This tech answered call bell again. Pt was half way out of the bed asking to walk around the room because of a leg cramp. This tech told the pt she could not get up until this tech could get a portable oxygen tank for the pt to use to move around. Pt stood up anyway and then sat back down and told this tech her leg was better and she did not need anything else. Pts call bell was placed within reach.

## 2018-01-08 NOTE — ED Notes (Signed)
Pt given ice water and sandwich tray at this time.

## 2018-01-09 ENCOUNTER — Encounter: Payer: Self-pay | Admitting: Cardiothoracic Surgery

## 2018-01-09 ENCOUNTER — Emergency Department: Payer: Medicare Other

## 2018-01-09 DIAGNOSIS — J982 Interstitial emphysema: Secondary | ICD-10-CM | POA: Insufficient documentation

## 2018-01-09 MED ORDER — MORPHINE SULFATE (PF) 2 MG/ML IV SOLN: 2.0000 mg | Freq: Once | INTRAVENOUS | Status: DC

## 2018-01-09 MED ORDER — IOPAMIDOL (ISOVUE-300) INJECTION 61%
100.0000 mL | Freq: Once | INTRAVENOUS | Status: AC | PRN
  Administered 2018-01-09: 100 mL via ORAL

## 2018-01-09 NOTE — ED Notes (Signed)
Pt taken to Xray at this time.

## 2018-01-09 NOTE — ED Notes (Signed)
Pt returned from X-ray.  

## 2018-01-09 NOTE — ED Notes (Signed)
NAD noted at time of D/C. Pt taken to lobby via wheelchair. Pt and husband state understanding regarding following up with Dr. Inez Catalinaakes.

## 2018-01-09 NOTE — ED Provider Notes (Signed)
Patient is undergone extensive evaluation in the ER, chest x-ray does show some improvement.  She is been evaluated by Dr. Thelma Bargeaks and will follow up with him in the office.   Molly FilbertWilliams, Ruffus Kamaka E, MD 01/09/18 [phone]

## 2018-01-09 NOTE — Progress Notes (Signed)
Patient ID: Molly Carlson, female   DOB: Aug 20, 1926, 84 y.o.   MRN: [id number]  No chief complaint on file.   Referred By Dr. Daryel November Reason for Referral pneumomediastinum  HPI Location, Quality, Duration, Severity, Timing, Context, Modifying Factors, Associated Signs and Symptoms.  Molly Carlson is a 84 y.o. female.  I have been asked to see Molly Carlson for consultation regarding her pneumomediastinum.  She is an 84 year old woman who awoke yesterday afternoon with the feeling of intense substernal chest pain.  She previously been seen by her primary care physician for a similar problem but this was much more severe at this point.  She also states that she had perhaps a cough and feeling of food being stuck in her throat.  After a coughing episode she came to the emergency department where chest x-ray was made.  She had some pneumomediastinum and some subcutaneous emphysema and a subsequent CT scan confirmed the presence of a pneumomediastinum with subcutaneous emphysema but no pneumothorax or pleural effusion.  The patient then had a barium swallow which did not reveal any evidence of perforation and a repeat chest x-ray performed approximately 12 hours later that did not reveal any pleural effusion or pneumothorax.  When I saw the patient she was currently asymptomatic.  She states that she was hungry.  She did not have any pain or shortness of breath.  She is on oxygen at home and has carried a diagnosis of interstitial lung disease.   Past Medical History:  Diagnosis Date  . Interstitial lung disease (HCC)     No past surgical history on file.  No family history on file.  Social History Social History   Tobacco Use  . Smoking status: Never Smoker  . Smokeless tobacco: Never Used  Substance Use Topics  . Alcohol use: Never    Frequency: Never  . Drug use: Never    No Known Allergies  Current Outpatient Medications  Medication Sig Dispense  Refill  . ATROVENT HFA 17 MCG/ACT inhaler Inhale 2 puffs into the lungs 2 (two) times daily.  2  . azaTHIOprine (IMURAN) 50 MG tablet Take 100 mg by mouth daily.     . FEROSUL 325 (65 Fe) MG tablet Take 325 mg by mouth 2 (two) times daily.    . montelukast (SINGULAIR) 10 MG tablet Take 10 mg by mouth at bedtime.  2  . predniSONE (DELTASONE) 10 MG tablet Take 15 mg by mouth daily.  0  . Prenatal Vit-Fe Fumarate-FA (PRENATAL 19) tablet Chew 1 tablet by mouth daily.  2  . Prenatal Vit-Fe Fumarate-FA (PRENATAL MULTIVITAMIN) TABS tablet Take 1 tablet by mouth daily at 12 noon.    Marland Kitchen PROAIR HFA 108 (90 Base) MCG/ACT inhaler Inhale 2 puffs into the lungs every 6 (six) hours as needed.  11  . sulfamethoxazole-trimethoprim (BACTRIM DS,SEPTRA DS) 800-160 MG tablet Take 1 tablet by mouth 3 (three) times a week. MONDAY,WEDNESDAY,FRIDAY  6   No current facility-administered medications for this visit.       Review of Systems A complete review of systems was asked and was negative except for the following positive findings see the above history of present illness  There were no vitals taken for this visit.  Physical Exam CONSTITUTIONAL:  Pleasant, well-developed, quite thin, and in no acute distress. EYES: Pupils equal and reactive to light, Sclera non-icteric EARS, NOSE, MOUTH AND THROAT:  The oropharynx was clear.  Dentition is good repair.  Oral mucosa pink and  moist. LYMPH NODES:  Lymph nodes in the neck and axillae were normal RESPIRATORY:  Lungs revealed extensive dry Velcro rales.  Normal respiratory effort without pathologic use of accessory muscles of respiration CARDIOVASCULAR: Heart was regular without murmurs.  There were no carotid bruits. GI: The abdomen was soft, nontender, and nondistended. There were no palpable masses. There was no hepatosplenomegaly. There were normal bowel sounds in all quadrants. GU:  Rectal deferred.   MUSCULOSKELETAL:  Normal muscle strength and tone.  No  clubbing or cyanosis.   SKIN:  There were no pathologic skin lesions.  There were no nodules on palpation. NEUROLOGIC:  Sensation is normal.  Cranial nerves are grossly intact. PSYCH:  Oriented to person, place and time.  Mood and affect are normal.  Data Reviewed CT scan and barium swallow  I have personally reviewed the patient's imaging, laboratory findings and medical records.    Assessment    Pneumomediastinum most likely secondary to tracking along a ruptured alveolar bleb.    Plan    At the present time she is asymptomatic and there is no sign of esophageal perforation.  I would continue to manage her pulmonary status on an outpatient and I be happy to see her in the office if needed.  I did give the patient my business card and asked her to contact us to make an appointment in 1 week.  We will obtain another chest x-ray at that time just to be certain that there is no other findings.  Thank you very much for allowing me to participate in her care       Hulda Marinimothy Briea Mcenery, MD 01/09/2018, 2:53 PM

## 2018-01-09 NOTE — Progress Notes (Unsigned)
Patient ID: Molly Carlson, female   DOB: 10/18/1926, 84 y.o.   MRN: [id number]  No chief complaint on file.   Referred By *** Reason for Referral ***  HPI Location, Quality, Duration, Severity, Timing, Context, Modifying Factors, Associated Signs and Symptoms.  Molly Carlson is a 84 y.o. female.  ***   Past Medical History:  Diagnosis Date  . Interstitial lung disease (HCC)     No past surgical history on file.  No family history on file.  Social History Social History   Tobacco Use  . Smoking status: Never Smoker  . Smokeless tobacco: Never Used  Substance Use Topics  . Alcohol use: Never    Frequency: Never  . Drug use: Never    No Known Allergies  Current Outpatient Medications  Medication Sig Dispense Refill  . ATROVENT HFA 17 MCG/ACT inhaler Inhale 2 puffs into the lungs 2 (two) times daily.  2  . azaTHIOprine (IMURAN) 50 MG tablet Take 100 mg by mouth daily.     . FEROSUL 325 (65 Fe) MG tablet Take 325 mg by mouth 2 (two) times daily.    . montelukast (SINGULAIR) 10 MG tablet Take 10 mg by mouth at bedtime.  2  . predniSONE (DELTASONE) 10 MG tablet Take 15 mg by mouth daily.  0  . Prenatal Vit-Fe Fumarate-FA (PRENATAL 19) tablet Chew 1 tablet by mouth daily.  2  . Prenatal Vit-Fe Fumarate-FA (PRENATAL MULTIVITAMIN) TABS tablet Take 1 tablet by mouth daily at 12 noon.    Marland Kitchen. PROAIR HFA 108 (90 Base) MCG/ACT inhaler Inhale 2 puffs into the lungs every 6 (six) hours as needed.  11  . sulfamethoxazole-trimethoprim (BACTRIM DS,SEPTRA DS) 800-160 MG tablet Take 1 tablet by mouth 3 (three) times a week. MONDAY,WEDNESDAY,FRIDAY  6   No current facility-administered medications for this visit.       Review of Systems A complete review of systems was asked and was negative except for the following positive findings***  There were no vitals taken for this visit.  Physical Exam CONSTITUTIONAL:  Pleasant, well-developed, well-nourished, and in no acute  distress. EYES: Pupils equal and reactive to light, Sclera non-icteric EARS, NOSE, MOUTH AND THROAT:  The oropharynx was clear.  Dentition is good repair.  Oral mucosa pink and moist. LYMPH NODES:  Lymph nodes in the neck and axillae were normal RESPIRATORY:  Lungs were clear.  Normal respiratory effort without pathologic use of accessory muscles of respiration CARDIOVASCULAR: Heart was regular without murmurs.  There were no carotid bruits. GI: The abdomen was soft, nontender, and nondistended. There were no palpable masses. There was no hepatosplenomegaly. There were normal bowel sounds in all quadrants. GU:  Rectal deferred.   MUSCULOSKELETAL:  Normal muscle strength and tone.  No clubbing or cyanosis.   SKIN:  There were no pathologic skin lesions.  There were no nodules on palpation. NEUROLOGIC:  Sensation is normal.  Cranial nerves are grossly intact. PSYCH:  Oriented to person, place and time.  Mood and affect are normal.  Data Reviewed ***  I have personally reviewed the patient's imaging, laboratory findings and medical records.    Assessment    ***    Plan    ***     Time spent with patient was *** minutes, with more than 50% of the time spent counseling and coordinating care of patient.    Hulda Marinimothy Kaiden Pech, MD 01/09/2018, 2:33 PM

## 2018-01-09 NOTE — ED Notes (Signed)
This RN to bedside to introduce self to patient. Pt sitting up on side of bed at this time. Pt denies any pain. Requesting something to eat, explained patient NPO until after swallow study. Pt states understanding at this time. No other complaints, will continue to monitor for further patient needs.

## 2018-01-09 NOTE — ED Notes (Signed)
Pt c/o severe cramping to lower legs. Pt tearful and apologetic. Asking to stand to see if that will help. Pt steady with ambulation. MD aware.

## 2018-01-10 ENCOUNTER — Other Ambulatory Visit: Payer: Self-pay

## 2018-01-10 DIAGNOSIS — J984 Other disorders of lung: Secondary | ICD-10-CM

## 2018-01-10 NOTE — Progress Notes (Signed)
Placed order for chest xray

## 2018-01-16 ENCOUNTER — Ambulatory Visit
Admission: RE | Admit: 2018-01-16 | Discharge: 2018-01-16 | Disposition: A | Discharge status: Home / Self Care | Payer: Medicare Other | Source: Ambulatory Visit | Attending: Cardiothoracic Surgery | Admitting: Cardiothoracic Surgery

## 2018-01-16 ENCOUNTER — Other Ambulatory Visit: Payer: Self-pay

## 2018-01-16 ENCOUNTER — Ambulatory Visit (INDEPENDENT_AMBULATORY_CARE_PROVIDER_SITE_OTHER): Payer: Medicare Other | Admitting: Cardiothoracic Surgery

## 2018-01-16 ENCOUNTER — Encounter: Payer: Self-pay | Admitting: Cardiothoracic Surgery

## 2018-01-16 VITALS — BP 113/72 | HR 114 | Temp 97.8°F

## 2018-01-16 DIAGNOSIS — J984 Other disorders of lung: Secondary | ICD-10-CM | POA: Insufficient documentation

## 2018-01-16 DIAGNOSIS — J8410 Pulmonary fibrosis, unspecified: Secondary | ICD-10-CM | POA: Diagnosis not present

## 2018-01-16 NOTE — Progress Notes (Signed)
  Patient ID: Molly Carlson, female   DOB: 05/24/1927, 84 y.o.   MRN: [id number]  HISTORY: She returns today in follow-up.  She states that she did very well after she went home from the emergency room.  She has had continued problems with her breathing but she is now back to her baseline.  [facility] [place] [facility]  She is currently on 3 L of nasal cannula.  She states that she has no complaints today.   Vitals:   01/16/18 0908  BP: 113/72  Pulse: (!) 114  Temp: 97.8 F (36.6 C)  SpO2: 93%     EXAM:    Resp: Lungs show dry crackles and rales at both bases (Velcro rales).  No respiratory distress, normal effort. Heart:  Regular without murmurs Abd:  Abdomen is soft, non distended and non tender. No masses are palpable.  There is no rebound and no guarding.  Neurological: Alert and oriented to person, place, and time. Coordination normal.  Skin: Skin is warm and dry. No rash noted. No diaphoretic. No erythema. No pallor.  Psychiatric: Normal mood and affect. Normal behavior. Judgment and thought content normal.    ASSESSMENT: She did have a chest x-ray made today which have independently reviewed.  I do not see any evidence of pneumothorax or pleural effusion.   PLAN:   She will continue her [facility].    Hulda Marinimothy Kaelynne Christley, MD

## 2018-01-16 NOTE — Patient Instructions (Signed)
Please call our office if you have questions or concerns.   

## 2019-10-02 IMAGING — CR DG CHEST 2V
2 series · 2 of 2 positions shown · non-contrast
Comparison: 01/08/2018

CLINICAL DATA: Chest pain

EXAM:
CHEST - 2 VIEW

[chest pa]
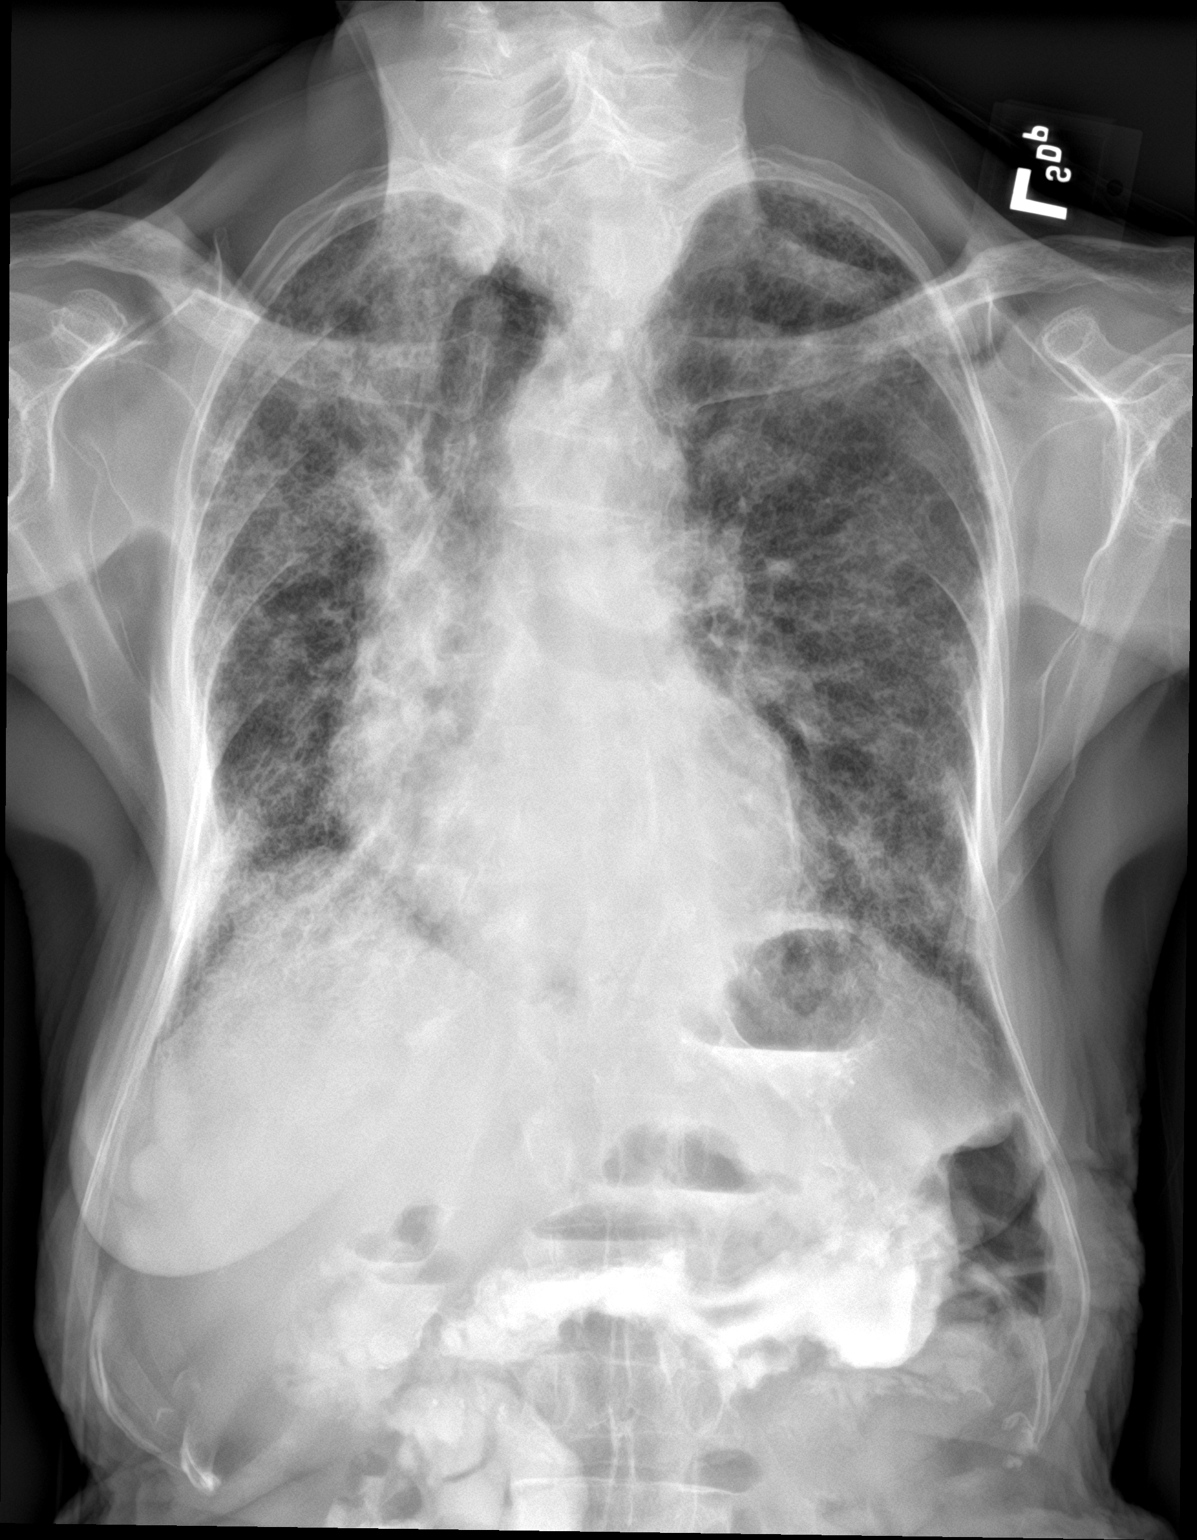

[chest lat]
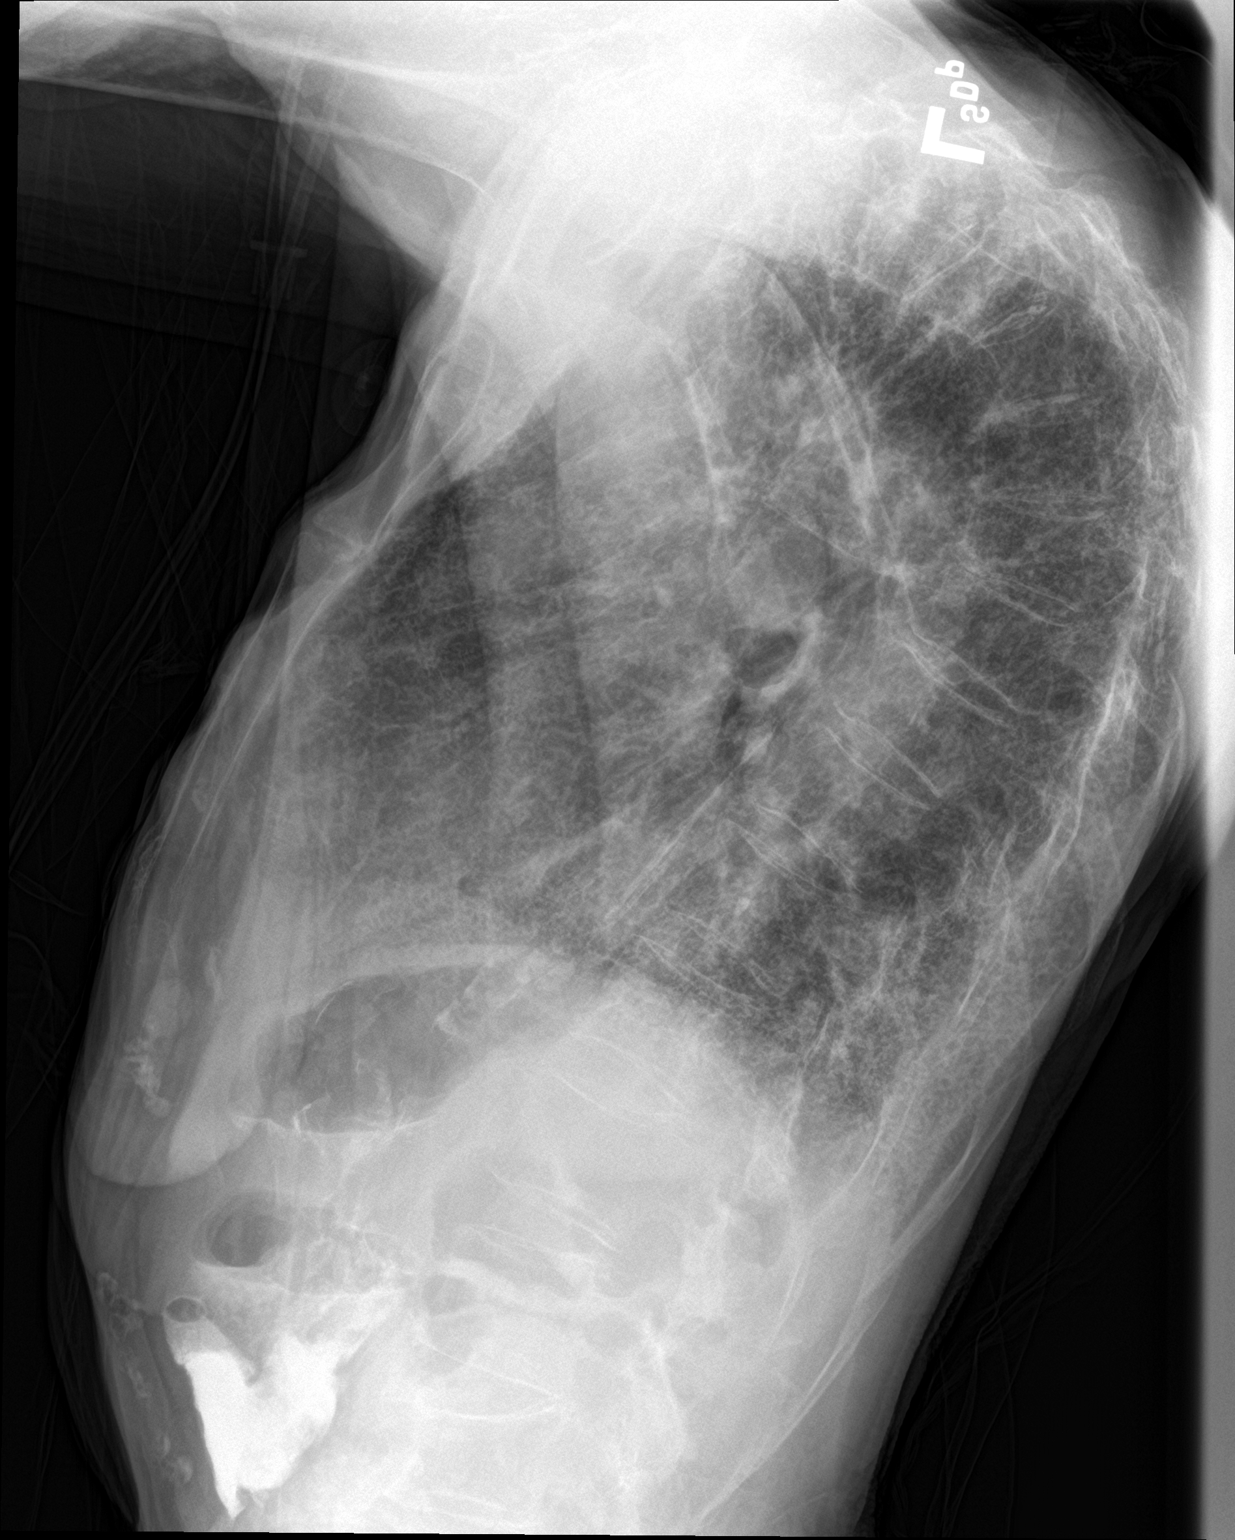

[2 of 2 positions shown; findings below may reference images not displayed]

FINDINGS: Left chest wall subcutaneous emphysema has reduced since prior
study. Severe diffuse bilateral airspace opacities and interstitial
prominence again noted, unchanged. Heart is borderline in size. No
visible pneumothorax or effusions.
IMPRESSION: Severe diffuse bilateral airspace disease superimposed on advanced
chronic interstitial lung disease, stable.

Borderline cardiomegaly.

Decreasing left chest wall subcutaneous emphysema. No visible
pneumothorax.

## 2019-10-24 ENCOUNTER — Emergency Department
Admission: EM | Admit: 2019-10-24 | Discharge: 2019-10-24 | Disposition: A | Discharge status: Home / Self Care | Payer: Medicare Other | Attending: Emergency Medicine | Admitting: Emergency Medicine

## 2019-10-24 ENCOUNTER — Other Ambulatory Visit: Payer: Self-pay

## 2019-10-24 DIAGNOSIS — J849 Interstitial pulmonary disease, unspecified: Secondary | ICD-10-CM | POA: Insufficient documentation

## 2019-10-24 DIAGNOSIS — K625 Hemorrhage of anus and rectum: Secondary | ICD-10-CM | POA: Insufficient documentation

## 2019-10-24 LAB — COMPREHENSIVE METABOLIC PANEL
ALT: 12 U/L (ref 0–44)
AST: 24 U/L (ref 15–41)
Albumin: 3.8 g/dL (ref 3.5–5.0)
Alkaline Phosphatase: 40 U/L (ref 38–126)
Anion gap: 10 (ref 5–15)
BUN: 16 mg/dL (ref 8–23)
CO2: 35 mmol/L — ABNORMAL HIGH (ref 22–32)
Calcium: 9.1 mg/dL (ref 8.9–10.3)
Chloride: 94 mmol/L — ABNORMAL LOW (ref 98–111)
Creatinine, Ser: 0.95 mg/dL (ref 0.44–1.00)
GFR calc Af Amer: >60 mL/min (ref >60)
GFR calc non Af Amer: 53 mL/min — ABNORMAL LOW (ref >60)
Glucose, Bld: 124 mg/dL — ABNORMAL HIGH (ref 70–99)
Potassium: 3.8 mmol/L (ref 3.5–5.1)
Sodium: 139 mmol/L (ref 135–145)
Total Bilirubin: 0.9 mg/dL (ref 0.3–1.2)
Total Protein: 7.7 g/dL (ref 6.5–8.1)

## 2019-10-24 LAB — CBC
HCT: 35.6 % — ABNORMAL LOW (ref 36.0–46.0)
Hemoglobin: 11.5 g/dL — ABNORMAL LOW (ref 12.0–15.0)
MCH: 32.7 pg (ref 26.0–34.0)
MCHC: 32.3 g/dL (ref 30.0–36.0)
MCV: 101.1 fL — ABNORMAL HIGH (ref 80.0–100.0)
Platelets: 230 10*3/uL (ref 150–400)
RBC: 3.52 MIL/uL — ABNORMAL LOW (ref 3.87–5.11)
RDW: 11.8 % (ref 11.5–15.5)
WBC: 10.1 10*3/uL (ref 4.0–10.5)
nRBC: 0 % (ref 0.0–0.2)

## 2019-10-24 LAB — HEMOGLOBIN AND HEMATOCRIT, BLOOD
HCT: 34.1 % — ABNORMAL LOW (ref 36.0–46.0)
Hemoglobin: 11.1 g/dL — ABNORMAL LOW (ref 12.0–15.0)

## 2019-10-24 NOTE — ED Notes (Addendum)
Patient on phone with primary pcp discussing plan of care after discharge today. S/o at bedside. Vss. Will continue to monitor

## 2019-10-24 NOTE — Discharge Instructions (Addendum)
Please seek medical attention for any high fevers, chest pain, shortness of breath, change in behavior, persistent vomiting, bloody stool or any other new or concerning symptoms.  

## 2019-10-24 NOTE — ED Triage Notes (Signed)
Pt arrives to ED via POV from home with c/o rectal bleeding for approximately the last 36 hrs. Pt reports bleeding has been intermittent. Pt reports bright red blood, no clots. Pt denies any c/o CP or SHOB (beyond her baseline with interstitial lung disease; pt uses 6-7L O2 chronically). Pt denies previous h/x of same; denies h/x of transfusions d/t religious reasons. Pt is A&O, in NAD; RR even, regular, and unlabored.

## 2019-10-24 NOTE — ED Provider Notes (Signed)
[facility] Emergency Department Provider Note   ____________________________________________   I have reviewed the triage vital signs and the nursing notes.   HISTORY  Chief Complaint Rectal Bleeding   History limited by: Not Limited   HPI Molly Carlson is a 87 y.o. female who presents to the emergency department today because of concern for rectal bleeding. She states she first noticed some bleeding two days ago.  She states that has been somewhat intermittent.  It is bright red as well as a slightly darker red color.  She states it only occurs when she is trying to have a bowel movement.  She denies any associated pain.  She has not noticed any worsening of her chronic shortness of breath.  She denies similar symptoms in the past.  States she is not on any blood thinners.   Records reviewed. Per medical record review patient has a history of lung disease, NSTEMI.  Past Medical History:  Diagnosis Date  . Interstitial lung disease (HCC)   . NSTEMI (non-ST elevated myocardial infarction) (HCC) 10/20/2017  . NSTEMI (non-ST elevated myocardial infarction) (HCC) 10/20/2017  . Pneumomediastinum [facility])     Patient Active Problem List   Diagnosis Date Noted  . Pneumomediastinum (HCC)   . NSTEMI (non-ST elevated myocardial infarction) (HCC) 10/20/2017  . Abnormal CT scan 08/20/2017  . Encounter for monitoring immunomodulating therapy 08/17/2017  . Lung nodule, multiple 11/07/2016  . Pulmonary interstitial fibrosis (HCC) 05/10/2016    History reviewed. No pertinent surgical history.  Prior to Admission medications   Medication Sig Start Date End Date Taking? Authorizing Provider  ATROVENT HFA 17 MCG/ACT inhaler Inhale 2 puffs into the lungs 2 (two) times daily. 11/30/17   [provider]  azaTHIOprine (IMURAN) 50 MG tablet Take 100 mg by mouth daily.     [provider]  FEROSUL 325 (65 Fe) MG tablet Take 325 mg by mouth 2 (two) times  daily.    [provider]  montelukast (SINGULAIR) 10 MG tablet Take 10 mg by mouth at bedtime. 11/16/17   [provider]  Nutritional Supplements (ENSURE CLEAR) LIQD Take by mouth. 11/16/17   [provider]  omeprazole (PRILOSEC) 20 MG capsule TAKE ONE CAPSULE BY MOUTH TWICE DAILY FOR 2 WEEKS 01/08/18   [provider]  predniSONE (DELTASONE) 10 MG tablet Take 15 mg by mouth daily. 12/15/17   [provider]  PROAIR HFA 108 (90 Base) MCG/ACT inhaler Inhale 2 puffs into the lungs every 6 (six) hours as needed. 11/16/17   [provider]  sulfamethoxazole-trimethoprim (BACTRIM DS,SEPTRA DS) 800-160 MG tablet Take 1 tablet by mouth 3 (three) times a week. MONDAY,WEDNESDAY,FRIDAY 12/26/17   [provider]    Allergies Cat hair extract  No family history on file.  Social History Social History   Tobacco Use  . Smoking status: Never Smoker  . Smokeless tobacco: Never Used  Substance Use Topics  . Alcohol use: Never  . Drug use: Never    Review of Systems Constitutional: No fever/chills Eyes: No visual changes. ENT: No sore throat. Cardiovascular: Denies chest pain. Respiratory: Denies shortness of breath. Gastrointestinal: No abdominal pain.  No nausea, no vomiting.  No diarrhea.  Positive for rectal bleed.  Genitourinary: Negative for dysuria. Musculoskeletal: Negative for back pain. Skin: Negative for rash. Neurological: Negative for headaches, focal weakness or numbness.  ____________________________________________   PHYSICAL EXAM:  VITAL SIGNS: ED Triage Vitals  Enc Vitals Group     BP 10/24/19  0646 130/75     Pulse Rate 10/24/19 0646 89     Resp 10/24/19 0646 18     Temp 10/24/19 0646 98.3 F (36.8 C)     Temp Source 10/24/19 0646 Oral     SpO2 10/24/19 0646 100 %     Weight 10/24/19 0642 80 lb (36.3 kg)     Height 10/24/19 0642 4\' 10"  (1.473 m)     Head Circumference --      Peak Flow --      Pain  Score 10/24/19 0642 0   Constitutional: Alert and oriented.  Eyes: Conjunctivae are normal.  ENT      Head: Normocephalic and atraumatic.      Nose: No congestion/rhinnorhea.      Mouth/Throat: Mucous membranes are moist.      Neck: No stridor. Hematological/Lymphatic/Immunilogical: No cervical lymphadenopathy. Cardiovascular: Normal rate, regular rhythm.  No murmurs, rubs, or gallops.  Respiratory: Normal respiratory effort without tachypnea nor retractions. Breath sounds are clear and equal bilaterally. No wheezes/rales/rhonchi. Gastrointestinal: Soft and non tender. No rebound. No guarding.  Rectal: No gross blood on glove. Brown stool. GUIAC positive. Musculoskeletal: Normal range of motion in all extremities. No lower extremity edema. Neurologic:  Normal speech and language. No gross focal neurologic deficits are appreciated.  Skin:  Skin is warm, dry and intact. No rash noted. Psychiatric: Mood and affect are normal. Speech and behavior are normal. Patient exhibits appropriate insight and judgment.  ____________________________________________    LABS (pertinent positives/negatives)  CBC wbc 10.1, hgb 11.5, plt 230 hgb 11.1 CMP na 139, k 3.8, glu 124, cr 0.95  ____________________________________________   EKG  None  ____________________________________________    RADIOLOGY  None  ____________________________________________   PROCEDURES  Procedures  ____________________________________________   INITIAL IMPRESSION / ASSESSMENT AND PLAN / ED COURSE  Pertinent labs & imaging results that were available during my care of the patient were reviewed by me and considered in my medical decision making (see chart for details).   Patient presented to the emergency department today because of concerns for rectal bleed that started 2 days ago.  On exam here patient was guaiac positive although there was no red blood on the glove.  I did discuss admission with the  patient.  Did offer admission and stated that we could watch her closer.  She however felt comfortable with observation.  In the emergency department and repeat hemoglobin hematocrit.  Repeat hemoglobin did show a slight decrease however nothing significant.  Additionally patient did not have any further episodes of bloody output here.  This point patient still wanted to go home which I think is reasonable.  I did discuss extremely strict return precautions with the patient. Discussed follow up with GI.  ____________________________________________   FINAL CLINICAL IMPRESSION(S) / ED DIAGNOSES  Final diagnoses:  Rectal bleeding     Note: This dictation was prepared with Dragon dictation. Any transcriptional errors that result from this process are unintentional     Nance Pear, MD 10/24/19 1450

## 2019-10-24 NOTE — ED Notes (Signed)
Reports rectal bleed that goes on and off describes it as bright red rectal bleed, denies hemorrhoids. Awaiting me eval and plan of care.

## 2019-11-05 ENCOUNTER — Other Ambulatory Visit: Payer: Self-pay | Admitting: Family Medicine

## 2019-11-05 ENCOUNTER — Ambulatory Visit
Admission: RE | Admit: 2019-11-05 | Discharge: 2019-11-05 | Disposition: A | Discharge status: Home / Self Care | Payer: Medicare Other | Source: Ambulatory Visit | Attending: Family Medicine | Admitting: Family Medicine

## 2019-11-05 DIAGNOSIS — K5909 Other constipation: Secondary | ICD-10-CM | POA: Diagnosis not present

## 2019-11-13 ENCOUNTER — Other Ambulatory Visit: Payer: Self-pay | Admitting: Family Medicine

## 2019-11-13 DIAGNOSIS — K5909 Other constipation: Secondary | ICD-10-CM

## 2019-11-13 DIAGNOSIS — R1084 Generalized abdominal pain: Secondary | ICD-10-CM

## 2019-11-20 ENCOUNTER — Other Ambulatory Visit: Payer: Self-pay

## 2019-11-20 ENCOUNTER — Ambulatory Visit: Payer: Medicare Other

## 2019-11-20 ENCOUNTER — Ambulatory Visit
Admission: RE | Admit: 2019-11-20 | Discharge: 2019-11-20 | Disposition: A | Discharge status: Home / Self Care | Payer: Medicare Other | Source: Ambulatory Visit | Attending: Family Medicine | Admitting: Family Medicine

## 2019-11-20 DIAGNOSIS — K5909 Other constipation: Secondary | ICD-10-CM | POA: Insufficient documentation

## 2019-11-20 DIAGNOSIS — R1084 Generalized abdominal pain: Secondary | ICD-10-CM | POA: Diagnosis present

## 2019-11-20 MED ORDER — IOHEXOL 300 MG/ML  SOLN
100.0000 mL | Freq: Once | INTRAMUSCULAR | Status: AC | PRN
  Administered 2019-11-20: 80 mL via INTRAVENOUS

## 2020-03-26 DEATH — deceased

## 2021-08-12 IMAGING — CT CT ABD-PELV W/ CM
1 of 3 series · 13 of 32 positions shown, 18 images · IV contrast (APPLIED)
Comparison: None.

CLINICAL DATA: Generalized abdominal pain.  Constipation.

EXAM:
CT ABDOMEN AND PELVIS WITH CONTRAST
TECHNIQUE: Multidetector CT imaging of the abdomen and pelvis was performed
using the standard protocol following bolus administration of
intravenous contrast.
CONTRAST:  80mL OMNIPAQUE IOHEXOL 300 MG/ML  SOLN

[Series 3: axial st · axial · 0.62mm/px · z∈[-789,-449]mm · 13 of 76 slices shown, 18 images]
[im 4/76  soft-tissue]
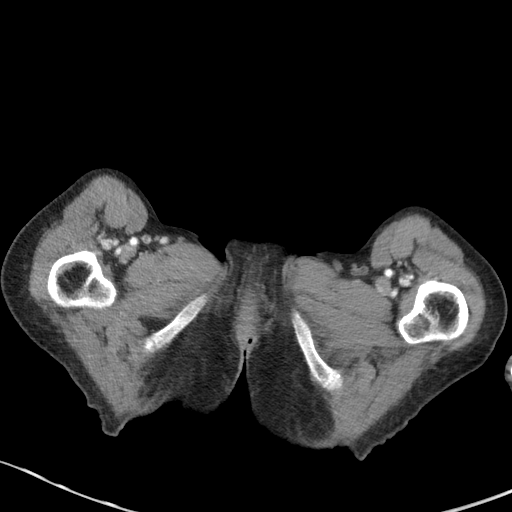
[im 4/76  bone]
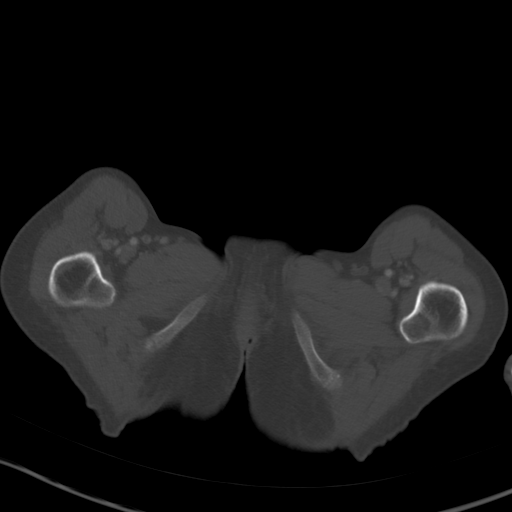
[im 12/76  soft-tissue]
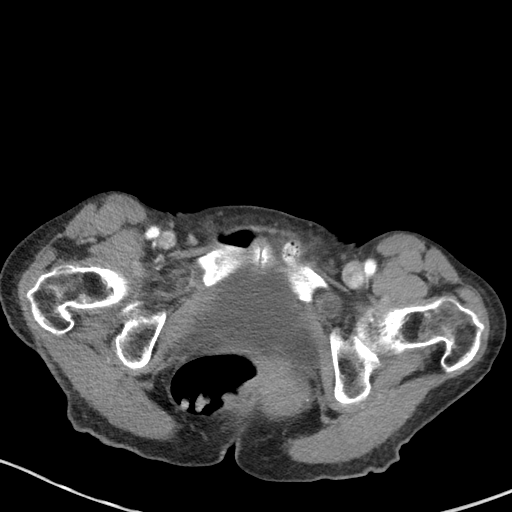
[im 16/76  soft-tissue]
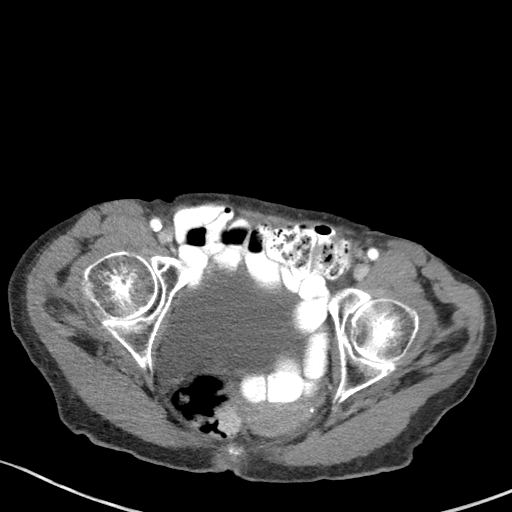
[im 24/76  soft-tissue]
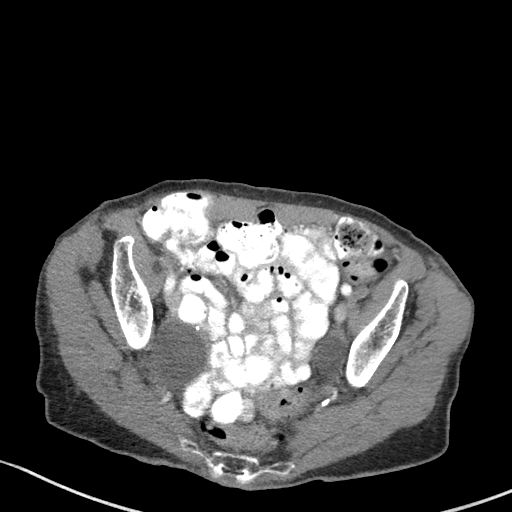
[im 28/76  soft-tissue]
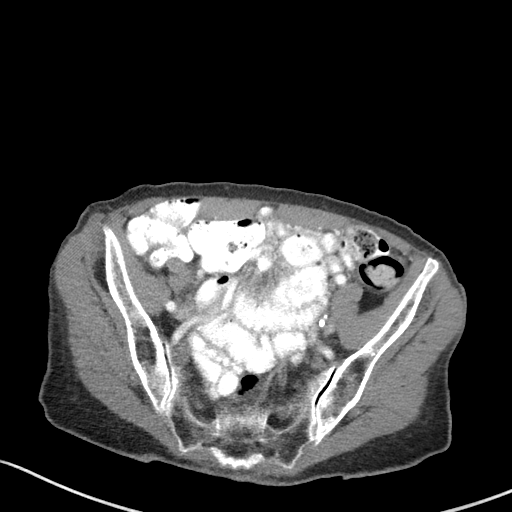
[im 36/76  soft-tissue]
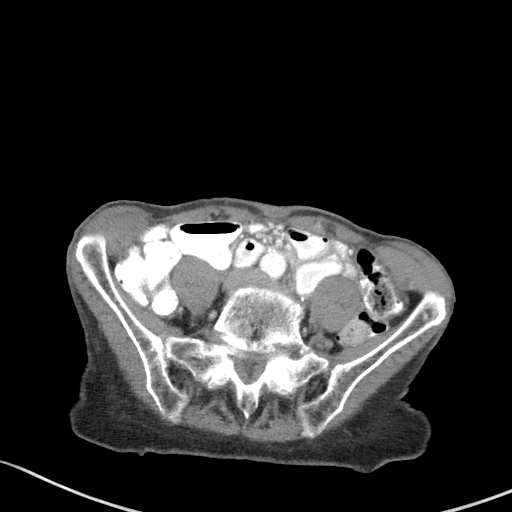
[im 40/76  soft-tissue]
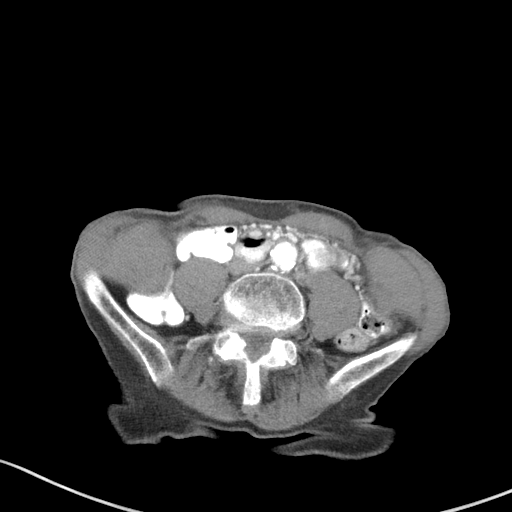
[im 48/76  soft-tissue]
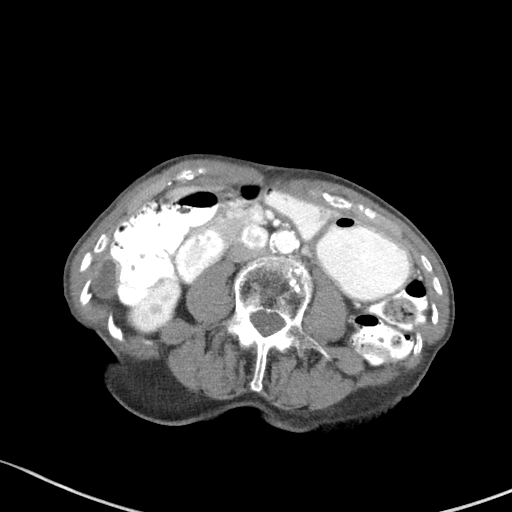
[im 52/76  soft-tissue]
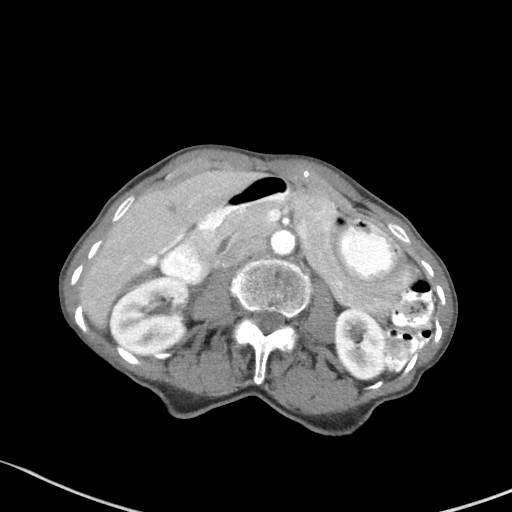
[im 52/76  bone]
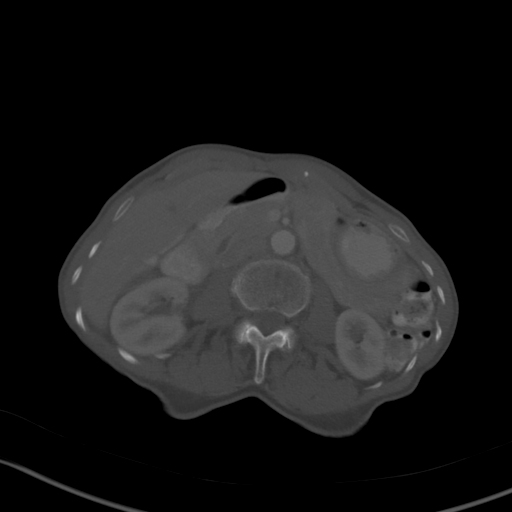
[im 60/76  soft-tissue]
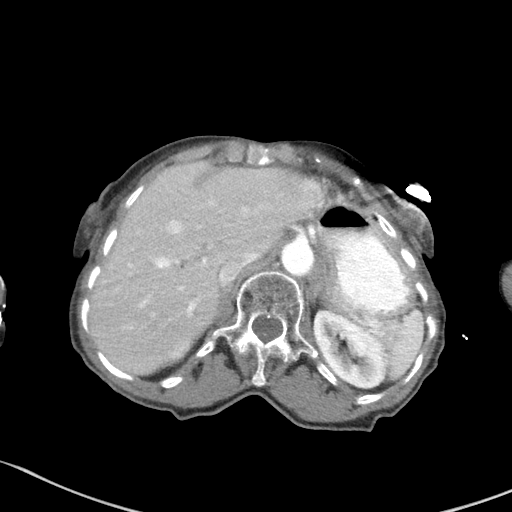
[im 60/76  lung]
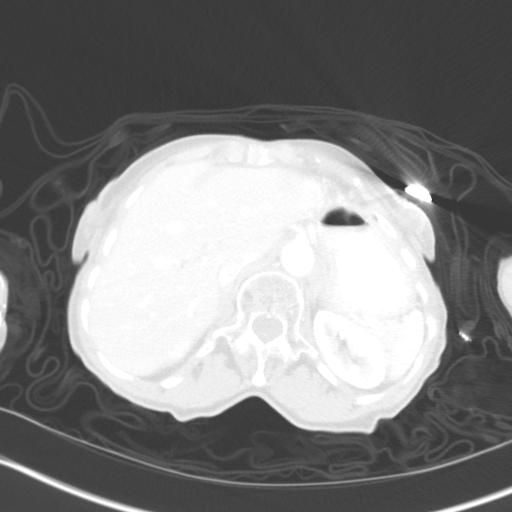
[im 64/76  soft-tissue]
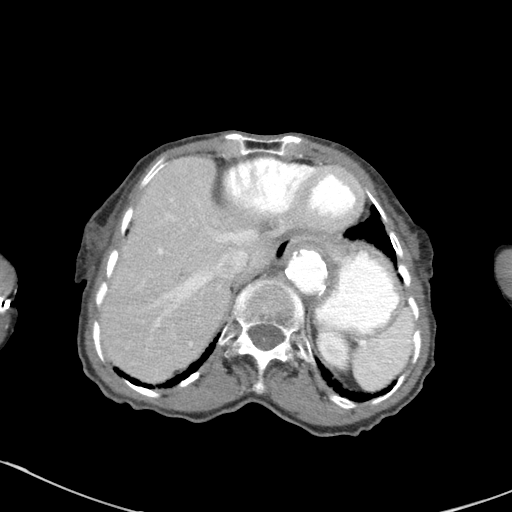
[im 64/76  lung]
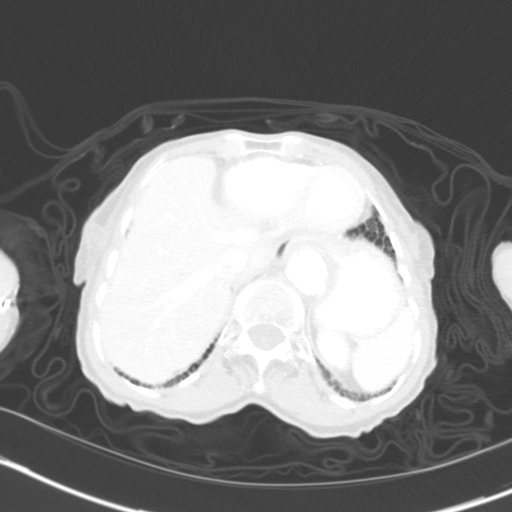
[im 68/76  lung]
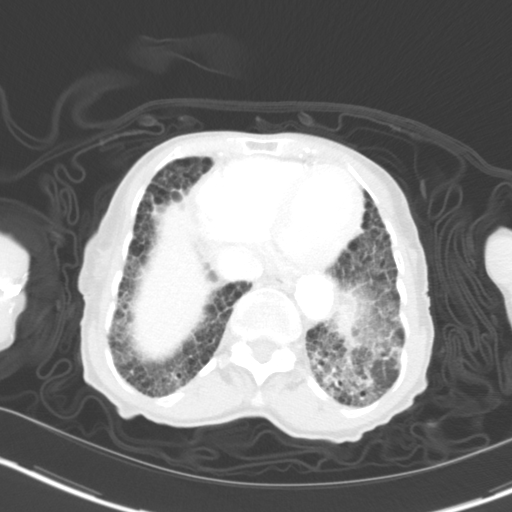
[im 72/76  soft-tissue]
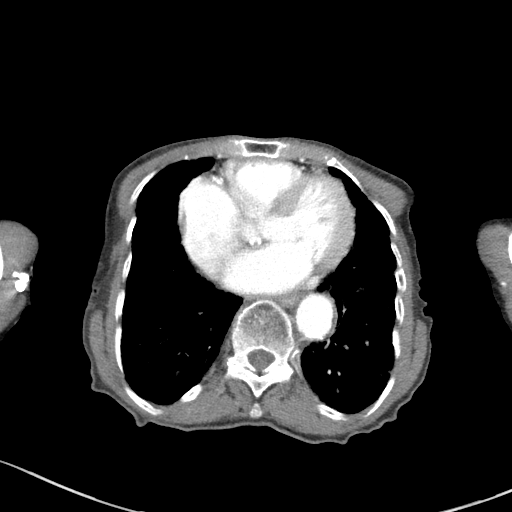
[im 72/76  lung]
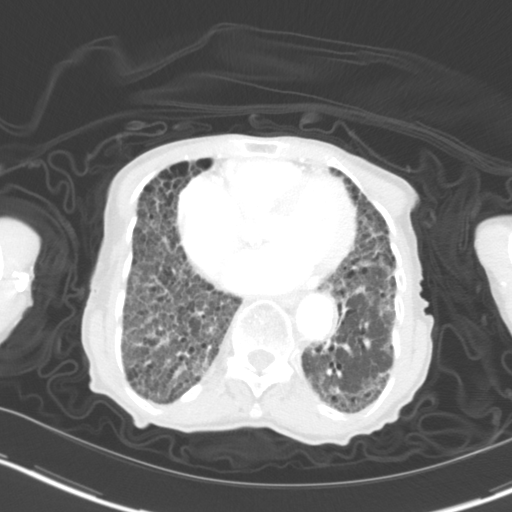

[13 of 32 positions shown; findings below may reference images not displayed]

FINDINGS: Lower chest: There is fibrotic change throughout the lung base
regions. There are foci of coronary artery calcification.

Hepatobiliary: No focal liver lesions are appreciable. The
gallbladder is located along the inferior liver edge. The
gallbladder is mildly contracted. There is no appreciable biliary
duct dilatation.

Pancreas: No pancreatic mass or inflammatory focus.

Spleen: No splenic lesions are evident.

Adrenals/Urinary Tract: Adrenals bilaterally appear unremarkable.
Kidneys bilaterally show no evident mass or hydronephrosis on either
side. There is no renal or ureteral calculus on either side. Urinary
bladder is midline with wall thickness within normal limits.

Stomach/Bowel: There is moderate stool in the colon. There is no
appreciable bowel wall or mesenteric thickening. The terminal ileal
region appears normal. No bowel obstruction evident. No free air or
portal venous air. No bowel pneumatosis.

Vascular/Lymphatic: There is extensive aortic atherosclerosis. There
is extensive calcification in the celiac artery as well as in the
proximal renal arteries bilaterally. There is proximal common iliac
artery atherosclerotic calcification, more severe on the left than
on the right. Major venous structures appear patent. There is no
adenopathy in the abdomen or pelvis.

Reproductive: The uterus is midline.  No pelvic mass appreciable.

Other: There is hypertrophy of the transversus abdominus muscles
bilaterally, slightly more prominent on the right than on the left.
No associated lesion identified in either of these muscle groups.
There is no periappendiceal region inflammation. No abscess or
ascites is evident in the abdomen or pelvis.

Musculoskeletal: There is anterior wedging of the L3 vertebral body.
There is degenerative change in the lumbar spine. Bones are
osteoporotic. No blastic or lytic bone lesions evident. No
intramuscular lesions are evident.
IMPRESSION: 1.  Bibasilar lung fibrosis.

2. Aortic Atherosclerosis ([id number]). There is also extensive
mesenteric arterial vascular calcification involving the celiac and
bilateral renal arteries. Note that there is no evident bowel
pneumatosis. No free air or portal venous air. Foci of coronary
artery calcification noted.

3.  Moderate stool in colon.  No evident bowel obstruction.

4. No abscess in the abdomen or pelvis. No periappendiceal region
inflammation.

5. Hypertrophy of the transversus abdominus muscles bilaterally,
slightly more notable on the right than on the left. No focal lesion
noted in these muscles. The clinical significance of this finding is
uncertain.

6.  Bones osteoporotic.
